# Patient Record
Sex: Female | Born: 1995
Health system: Southern US, Community
[De-identification: ages and names within clinical notes are randomized; demographics above are authoritative.]

## PROBLEM LIST (undated history)

## (undated) DIAGNOSIS — F32A Depression, unspecified: Secondary | ICD-10-CM

## (undated) DIAGNOSIS — F419 Anxiety disorder, unspecified: Secondary | ICD-10-CM

## (undated) HISTORY — DX: Anxiety disorder, unspecified: F41.9

## (undated) HISTORY — DX: Depression, unspecified: F32.A

---
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
## 2015-12-26 ENCOUNTER — Other Ambulatory Visit: Payer: Self-pay | Admitting: Family Medicine

## 2016-05-16 ENCOUNTER — Emergency Department (HOSPITAL_BASED_OUTPATIENT_CLINIC_OR_DEPARTMENT_OTHER): Payer: Self-pay

## 2016-05-16 ENCOUNTER — Emergency Department (HOSPITAL_BASED_OUTPATIENT_CLINIC_OR_DEPARTMENT_OTHER)
Admission: EM | Admit: 2016-05-16 | Discharge: 2016-05-16 | Disposition: A | Discharge status: Home / Self Care | Payer: Self-pay | Attending: Physician Assistant | Admitting: Physician Assistant

## 2016-05-16 ENCOUNTER — Encounter (HOSPITAL_BASED_OUTPATIENT_CLINIC_OR_DEPARTMENT_OTHER): Payer: Self-pay | Admitting: Respiratory Therapy

## 2016-05-16 DIAGNOSIS — J209 Acute bronchitis, unspecified: Secondary | ICD-10-CM | POA: Insufficient documentation

## 2016-05-16 MED ORDER — ALBUTEROL SULFATE HFA 108 (90 BASE) MCG/ACT IN AERS
2.0000 | INHALATION_SPRAY | RESPIRATORY_TRACT | Status: DC | PRN
  Administered 2016-05-16: 2 via RESPIRATORY_TRACT
  Filled 2016-05-16: qty 6.7

## 2016-05-16 MED ORDER — BENZONATATE 100 MG PO CAPS: 100.0000 mg | ORAL_CAPSULE | Freq: Three times a day (TID) | ORAL | 0 refills | Status: DC

## 2016-05-16 NOTE — ED Notes (Signed)
PA at bedside.

## 2016-05-16 NOTE — ED Provider Notes (Signed)
[facility] Provider Note   CSN: [id number] Arrival date & time: 05/16/16  1105  First Provider Contact:  None       History   Chief Complaint Chief Complaint  Patient presents with  . Cough    HPI Joanne Perez is a 20 y.o. female.  Patient presents today with a cough onset 3 days ago.  Cough gradually worsening.  She reports that today she began coughing up mucous, which prompted her to come in for evaluation.  She denies fever or chills.  She states that she had a temp of 99 three days ago.  She reports mild associated SOB.  Denies chest pain.  Denies sinus pain.  Does report associated congestion and right ear pain.  She has taken Tylenol for symptoms without improvement.  She denies any history of Asthma.  She does not smoke and never has.        History reviewed. No pertinent past medical history.  There are no active problems to display for this patient.   History reviewed. No pertinent surgical history.  OB History    No data available       Home Medications    Prior to Admission medications   Not on File    Family History Family History  Problem Relation Age of Onset  . Diabetes Father   . Diabetes Maternal Grandmother   . Diabetes Maternal Grandfather   . Diabetes Paternal Grandmother     Social History Social History  Substance Use Topics  . Smoking status: Never Smoker  . Smokeless tobacco: Never Used  . Alcohol use No     Allergies   Review of patient's allergies indicates no known allergies.   Review of Systems Review of Systems  All other systems reviewed and are negative.    Physical Exam Updated Vital Signs BP 120/70 (BP Location: Right Arm)   Pulse 68   Temp 98.6 F (37 C) (Oral)   Resp 20   Ht  (1.6 m)   Wt 68 kg   LMP 05/06/2016   SpO2 98%   BMI 26.57 kg/m   Physical Exam  Constitutional: She appears well-developed and well-nourished.  HENT:  Head: Normocephalic and atraumatic.  Right Ear:  Tympanic membrane and ear canal normal.  Left Ear: Tympanic membrane and ear canal normal.  Mouth/Throat: Oropharynx is clear and moist.  Neck: Normal range of motion. Neck supple.  Cardiovascular: Normal rate and regular rhythm.   Pulmonary/Chest: Effort normal and breath sounds normal. No respiratory distress. She has no wheezes. She has no rales. She exhibits no tenderness.  Neurological: She is alert.  Skin: Skin is warm and dry. No rash noted.  Psychiatric: She has a normal mood and affect.  Nursing note and vitals reviewed.    ED Treatments / Results  Labs (all labs ordered are listed, but only abnormal results are displayed) Labs Reviewed - No data to display  EKG  EKG Interpretation None       Radiology Dg Chest 2 View  Result Date: 05/16/2016 CLINICAL DATA:  Cough, chest pain and congestion, 3 days duration. Fever. EXAM: CHEST  2 VIEW COMPARISON:  01/11/2006 FINDINGS: Heart size is normal. Mediastinal shadows are normal. There is central bronchial thickening consistent with bronchitis. No infiltrate, collapse or effusion. Bony structures are normal except for mild spinal curvature. IMPRESSION: Bronchitis.  No consolidation or collapse. Mild spinal curvature. Electronically Signed   By: Paulina Fusi M.D.   On: 05/16/2016 11:30  Procedures Procedures (including critical care time)  Medications Ordered in ED Medications - No data to display   Initial Impression / Assessment and Plan / ED Course  I have reviewed the triage vital signs and the nursing notes.  Pertinent labs & imaging results that were available during my care of the patient were reviewed by me and considered in my medical decision making (see chart for details).  Clinical Course    Patient presents today with a cough x 3 days.  She states that this morning she began coughing up mucous.  She is afebrile.  No hypoxia or signs of respiratory distress.  CXR showing signs of Bronchitis, but no  consolidation.  Patient stable for discharge.  Return precautions given.    Final Clinical Impressions(s) / ED Diagnoses   Final diagnoses:  None    New Prescriptions New Prescriptions   No medications on file     Santiago Glad, PA-C 05/16/16 1207    Courteney Lyn Mackuen, MD 05/16/16 1519

## 2016-05-16 NOTE — ED Triage Notes (Signed)
C/o prod cough x 1 week-NAD-steady gait 

## 2016-08-02 ENCOUNTER — Ambulatory Visit (INDEPENDENT_AMBULATORY_CARE_PROVIDER_SITE_OTHER): Payer: Self-pay | Admitting: Physician Assistant

## 2016-08-02 VITALS — BP 118/70 | HR 87 | Temp 97.5°F | Resp 18 | Ht 63.0 in | Wt 150.8 lb

## 2016-08-02 DIAGNOSIS — R8290 Unspecified abnormal findings in urine: Secondary | ICD-10-CM

## 2016-08-02 DIAGNOSIS — N898 Other specified noninflammatory disorders of vagina: Secondary | ICD-10-CM

## 2016-08-02 LAB — POCT WET + KOH PREP
TRICH BY WET PREP: ABSENT
YEAST BY KOH: ABSENT
YEAST BY WET PREP: ABSENT

## 2016-08-02 LAB — POCT URINALYSIS DIP (MANUAL ENTRY)
Bilirubin, UA: NEGATIVE
GLUCOSE UA: NEGATIVE
Ketones, POC UA: NEGATIVE
LEUKOCYTES UA: NEGATIVE
NITRITE UA: NEGATIVE
PROTEIN UA: NEGATIVE
UROBILINOGEN UA: 0.2
pH, UA: 5.5

## 2016-08-02 LAB — POC MICROSCOPIC URINALYSIS (UMFC)

## 2016-08-02 MED ORDER — METRONIDAZOLE 500 MG PO TABS: 500.0000 mg | ORAL_TABLET | Freq: Two times a day (BID) | ORAL | 0 refills | Status: DC

## 2016-08-02 NOTE — Patient Instructions (Addendum)
Do not drink alcohol while on this medication. Please follow up in symptoms do not resolve.   Bacterial Vaginosis Bacterial vaginosis is an infection of the vagina. It happens when too many germs (bacteria) grow in the vagina. Having this infection puts you at risk for getting other infections from sex. Treating this infection can help lower your risk for other infections, such as:   Chlamydia.  Gonorrhea.  HIV.  Herpes. HOME CARE  Take your medicine as told by your doctor.  Finish your medicine even if you start to feel better.  Tell your sex partner that you have an infection. They should see their doctor for treatment.  During treatment:  Avoid sex or use condoms correctly.  Do not douche.  Do not drink alcohol unless your doctor tells you it is ok.  Do not breastfeed unless your doctor tells you it is ok. GET HELP IF:  You are not getting better after 3 days of treatment.  You have more grey fluid (discharge) coming from your vagina than before.  You have more pain than before.  You have a fever. MAKE SURE YOU:   Understand these instructions.  Will watch your condition.  Will get help right away if you are not doing well or get worse.   This information is not intended to replace advice given to you by your health care provider. Make sure you discuss any questions you have with your health care provider.   Document Released: 07/09/2008 Document Revised: 10/21/2014 Document Reviewed: 05/12/2013 Elsevier Interactive Patient Education 2016 [employer]    IF you received an x-ray today, you will receive an invoice from [facility]. Please contact [facility] at [phone] with questions or concerns regarding your invoice.   IF you received labwork today, you will receive an invoice from [facility]. Please contact [facility] at [phone] with questions or concerns regarding your invoice.   Our billing staff  will not be able to assist you with questions regarding bills from these companies.  You will be contacted with the lab results as soon as they are available. The fastest way to get your results is to activate your My Chart account. Instructions are located on the last page of this paperwork. If you have not heard from us regarding the results in 2 weeks, please contact this office.

## 2016-08-02 NOTE — Progress Notes (Signed)
08/02/2016 at 9:25 AM  Joanne Perez / DOB: Aug 18, 1996 / MRN: [id number]  The patient  does not have a problem list on file.  SUBJECTIVE  Joanne Perez is a 20 y.o. female who complains of dysuria and vaginal itching x 2 weeks ago. Notes that she noticed these symptoms after using a bath bomb for the first time. She then took azo and those symptoms resolved but then she developed a thick white discharge that has an odor and malodorous urine.  She denies dysuria, hematuria, urinary frequency, flank pain, abdominal pain, pelvic pain and genital rash. Pt is sexually active with monogamous partner for 2 years. LMP was 07/23/16. Pt drinks about 2 bottles of water a day.   She  has no past medical history on file.    Medications reviewed and updated by myself where necessary, and exist elsewhere in the encounter.   Joanne Perez has No Known Allergies. She  reports that she has never smoked. She has never used smokeless tobacco. She reports that she does not drink alcohol or use drugs. She  has no sexual activity history on file. The patient  has no past surgical history on file.  Her family history includes Diabetes in her father, maternal grandfather, maternal grandmother, and paternal grandmother.  Review of Systems  Constitutional: Negative for chills, diaphoresis and fever.   OBJECTIVE  Her  height is 5\' 3"  (1.6 m) and weight is 150 lb 12.8 oz (68.4 kg). Her oral temperature is 97.5 F (36.4 C). Her blood pressure is 118/70 and her pulse is 87. Her respiration is 18 and oxygen saturation is 99%.  The patient's body mass index is 26.71 kg/m.  Physical Exam  Constitutional: She appears well-developed.  HENT:  Head: Normocephalic and atraumatic.  Eyes: Conjunctivae are normal.  Neck: Normal range of motion.  GI: Soft. There is no tenderness.  Genitourinary: Uterus normal. There is no rash or tenderness on the right labia. There is no rash or tenderness on the left labia. Cervix exhibits  no motion tenderness. Right adnexum displays no mass, no tenderness and no fullness. Left adnexum displays no mass, no tenderness and no fullness. No tenderness in the vagina. Vaginal discharge (creamy white discharge lining vaginal canal) found.    Results for orders placed or performed in visit on 08/02/16 (from the past 24 hour(s))  POCT urinalysis dipstick     Status: Abnormal   Collection Time: 08/02/16  8:21 AM  Result Value Ref Range   Color, UA yellow yellow   Clarity, UA clear clear   Glucose, UA negative negative   Bilirubin, UA negative negative   Ketones, POC UA negative negative   Spec Grav, UA >=1.030    Blood, UA moderate (A) negative   pH, UA 5.5    Protein Ur, POC negative negative   Urobilinogen, UA 0.2    Nitrite, UA Negative Negative   Leukocytes, UA Negative Negative  POCT Microscopic Urinalysis ([facility])     Status: Abnormal   Collection Time: 08/02/16  8:26 AM  Result Value Ref Range   WBC,UR,HPF,POC None None WBC/hpf   RBC,UR,HPF,POC Few (A) None RBC/hpf   Bacteria Moderate (A) None, Too numerous to count   Mucus Present (A) Absent   Epithelial Cells, UR Per Microscopy Few (A) None, Too numerous to count cells/hpf  POCT Wet + KOH Prep     Status: Abnormal   Collection Time: 08/02/16  8:34 AM  Result Value Ref Range   Yeast by KOH  Absent Present, Absent   Yeast by wet prep Absent Present, Absent   WBC by wet prep Few None, Few, Too numerous to count   Clue Cells Wet Prep HPF POC Moderate (A) None, Too numerous to count   Trich by wet prep Absent Present, Absent   Bacteria Wet Prep HPF POC Moderate (A) None, Few, Too numerous to count   Epithelial Cells By [employer]et Pref ([facility]) None None, Few, Too numerous to count   RBC,UR,HPF,POC None None RBC/hpf   ASSESSMENT & PLAN  Joanne Perez was seen today for vaginitis.  Diagnoses and all orders for this visit:  Malodorous urine -     POCT Microscopic Urinalysis ([facility]) -     POCT urinalysis dipstick  Vaginal  discharge -     POCT Wet + KOH Prep -     GC/Chlamydia Probe Amp  Other orders -     metroNIDAZOLE (FLAGYL) 500 MG tablet; Take 1 tablet (500 mg total) by mouth 2 (two) times daily.   -Due to patient's history and symptoms, will treat for BV. .  The patient was advised to call or come back to clinic if she does not see an improvement in symptoms with treatment, or worsens with the above plan.   Joanne CoreBrittany Wesly Whisenant, PA-C [facility] 08/02/2016 9:25 AM

## 2016-08-05 LAB — GC/CHLAMYDIA PROBE AMP
CT Probe RNA: NOT DETECTED
GC PROBE AMP APTIMA: NOT DETECTED

## 2016-08-07 ENCOUNTER — Telehealth: Payer: Self-pay | Admitting: Emergency Medicine

## 2016-08-07 ENCOUNTER — Telehealth: Payer: Self-pay

## 2016-08-07 NOTE — Telephone Encounter (Signed)
Pt given normal test results dated from 08/02/16 Please post results

## 2016-08-07 NOTE — Telephone Encounter (Signed)
Pt is looking for lab results  Best number [phone]

## 2016-08-08 ENCOUNTER — Encounter: Payer: Self-pay | Admitting: Physician Assistant

## 2016-08-08 NOTE — Progress Notes (Signed)
Please send lab letter to patient

## 2017-06-17 ENCOUNTER — Telehealth (HOSPITAL_COMMUNITY): Payer: Self-pay

## 2017-06-18 ENCOUNTER — Telehealth (HOSPITAL_COMMUNITY): Payer: Self-pay

## 2017-07-15 ENCOUNTER — Ambulatory Visit (HOSPITAL_COMMUNITY): Payer: Self-pay | Admitting: Psychiatry

## 2017-08-11 ENCOUNTER — Ambulatory Visit (HOSPITAL_COMMUNITY): Payer: Self-pay | Admitting: Psychiatry

## 2017-09-06 IMAGING — DX DG CHEST 2V
2 series · 2 of 2 positions shown · non-contrast
Comparison: 01/11/2006

CLINICAL DATA: Cough, chest pain and congestion, 3 days duration.
Fever.

EXAM:
CHEST  2 VIEW

[chest pa]
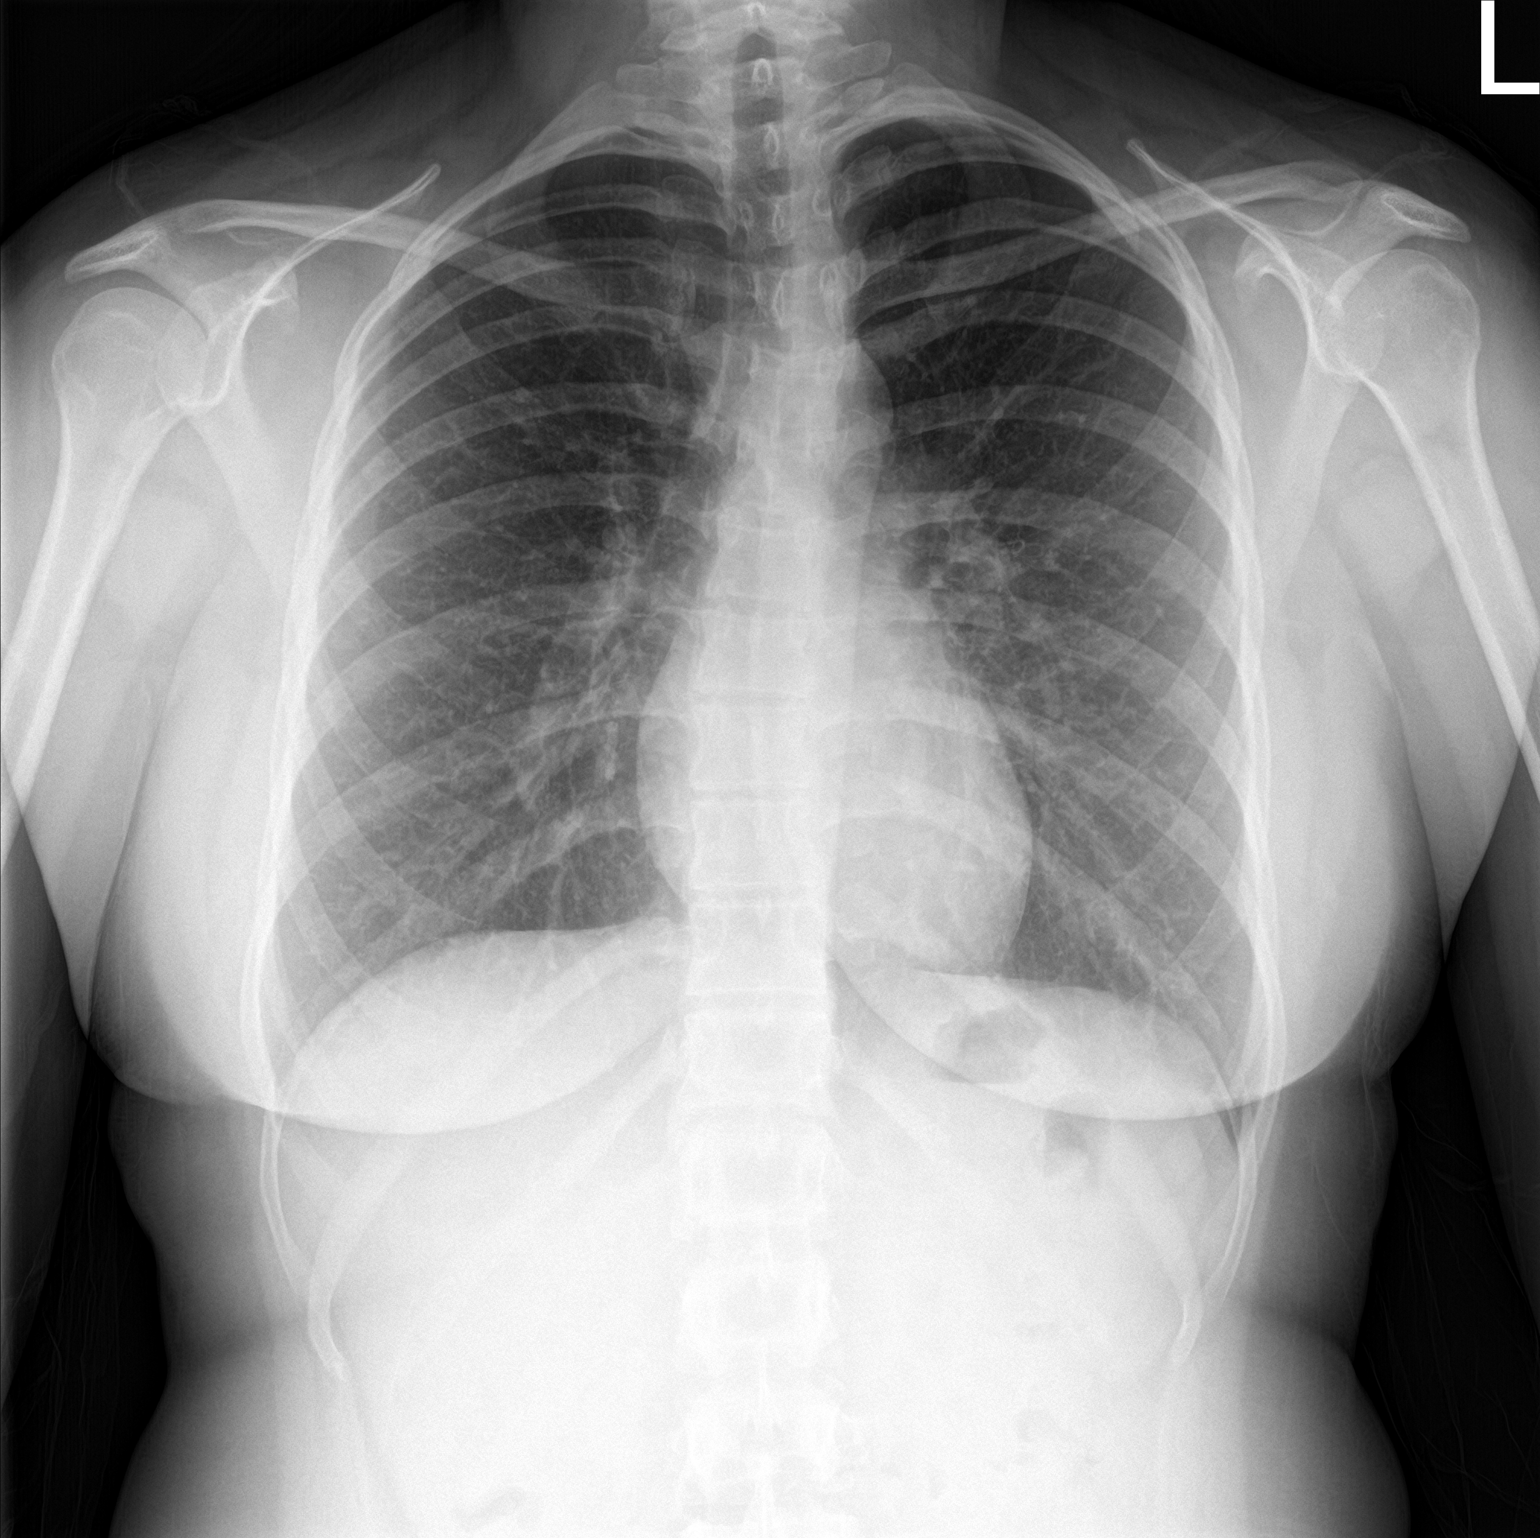

[chest lat]
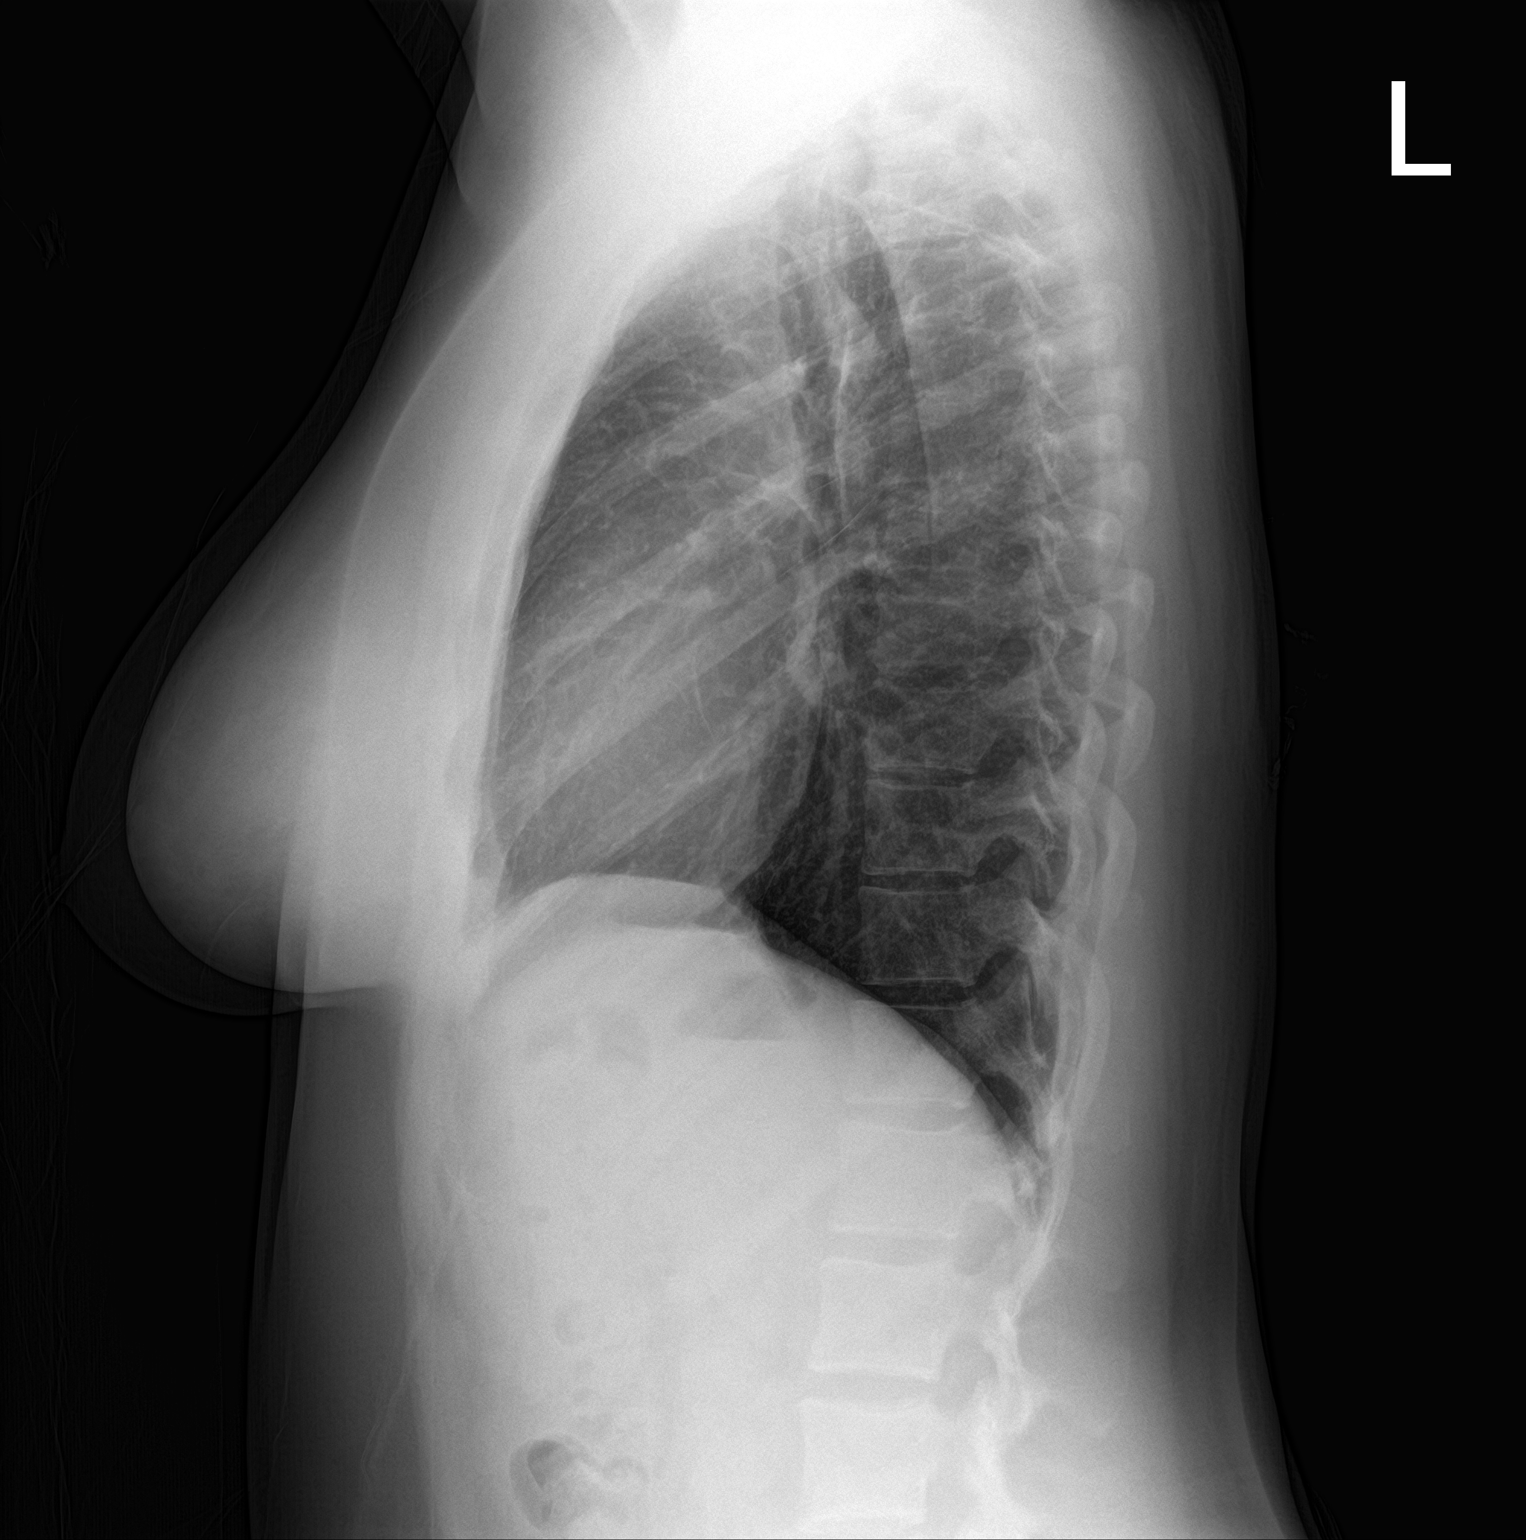

[2 of 2 positions shown; findings below may reference images not displayed]

FINDINGS: Heart size is normal. Mediastinal shadows are normal. There is
central bronchial thickening consistent with bronchitis. No
infiltrate, collapse or effusion. Bony structures are normal except
for mild spinal curvature.
IMPRESSION: Bronchitis.  No consolidation or collapse.

Mild spinal curvature.

## 2018-11-20 ENCOUNTER — Ambulatory Visit (INDEPENDENT_AMBULATORY_CARE_PROVIDER_SITE_OTHER): Payer: BLUE CROSS/BLUE SHIELD | Admitting: Family Medicine

## 2018-11-20 ENCOUNTER — Encounter: Payer: Self-pay | Admitting: Family Medicine

## 2018-11-20 ENCOUNTER — Other Ambulatory Visit: Payer: Self-pay

## 2018-11-20 VITALS — BP 119/70 | HR 75 | Temp 99.4°F | Ht 62.0 in | Wt 157.8 lb

## 2018-11-20 DIAGNOSIS — Z3041 Encounter for surveillance of contraceptive pills: Secondary | ICD-10-CM

## 2018-11-20 DIAGNOSIS — R5383 Other fatigue: Secondary | ICD-10-CM | POA: Diagnosis not present

## 2018-11-20 DIAGNOSIS — Z01411 Encounter for gynecological examination (general) (routine) with abnormal findings: Secondary | ICD-10-CM

## 2018-11-20 DIAGNOSIS — Z113 Encounter for screening for infections with a predominantly sexual mode of transmission: Secondary | ICD-10-CM

## 2018-11-20 DIAGNOSIS — Z23 Encounter for immunization: Secondary | ICD-10-CM | POA: Diagnosis not present

## 2018-11-20 DIAGNOSIS — Z01419 Encounter for gynecological examination (general) (routine) without abnormal findings: Secondary | ICD-10-CM

## 2018-11-20 DIAGNOSIS — F418 Other specified anxiety disorders: Secondary | ICD-10-CM

## 2018-11-20 LAB — POCT URINE PREGNANCY: Preg Test, Ur: NEGATIVE

## 2018-11-20 MED ORDER — TRI-SPRINTEC 0.18/0.215/0.25 MG-35 MCG PO TABS: 1.0000 | ORAL_TABLET | Freq: Every day | ORAL | 3 refills | Status: DC

## 2018-11-20 MED ORDER — ESCITALOPRAM OXALATE 10 MG PO TABS: 10.0000 mg | ORAL_TABLET | Freq: Every day | ORAL | 2 refills | Status: DC

## 2018-11-20 NOTE — Progress Notes (Signed)
2/7/20201:52 PM  Joanne Perez 1996/09/13, 23 y.o. female [id number]  Chief Complaint  Patient presents with  . Annual Exam    CPE w/ PAP  . Fatigue  . Insomnia    HPI:   Patient is a 23 y.o. 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  Fall Risk  11/20/2018 08/02/2016  Falls in the past year? 0 No  Number falls in past yr: 0 -  Injury with Fall? 1 -  Follow up Falls evaluation completed -    GAD 7 : Generalized Anxiety Score 11/20/2018  Nervous, Anxious, on Edge 2  Control/stop worrying 2  Worry too much - different things 2  Trouble relaxing 2  Restless 0  Easily annoyed or irritable 3  Afraid - awful might happen 2  Total GAD 7 Score 13  Anxiety Difficulty Somewhat difficult    Depression screen [facility] 2/[facility] 11/20/2018 08/02/2016  Decreased Interest 2 0  Down, Depressed, Hopeless 1 0  PHQ - 2 Score 3 0  Altered sleeping 3 -  Tired, decreased energy 3 -  Change in appetite 2 -  Feeling bad or failure about yourself  1 -  Trouble concentrating 2 -  Moving slowly or fidgety/restless 0 -  Suicidal thoughts 0 -  PHQ-9 Score 14 -  Difficult doing work/chores Somewhat difficult -    No Known Allergies  Prior to Admission medications   Medication Sig Start Date End Date Taking? Authorizing Provider  traZODone (DESYREL) 100 MG tablet Take 100 mg by mouth at bedtime.   Yes [provider]  TRI-SPRINTEC 0.18/0.215/0.25 MG-35 MCG tablet TAKE 1  TABLET AS DIRECTED 11/06/18  Yes [provider]    No past medical history on file.  No past surgical history on file.  Social History   Tobacco Use  . Smoking status: Never Smoker  . Smokeless tobacco: Never Used  Substance Use Topics  . Alcohol use: No    Alcohol/week: 0.0 standard drinks    Family History  Problem Relation Age of Onset  . Diabetes Father   . Diabetes Maternal Grandmother   . Diabetes Maternal Grandfather   . Diabetes Paternal Grandmother     Review of Systems  Constitutional: Positive for malaise/fatigue. Negative for chills and fever.  Respiratory: Negative for cough and shortness of breath.   Cardiovascular: Negative for chest pain, palpitations and leg swelling.  Gastrointestinal: Negative for abdominal pain, nausea and vomiting.  Psychiatric/Behavioral: Positive for depression. Negative for suicidal ideas.  All other systems reviewed and are negative.    OBJECTIVE:  Blood pressure 119/70, pulse 75, temperature 99.4 F (37.4 C), temperature source Oral, height 5\' 2"  (1.575 m), weight 157 lb 12.8 oz (71.6 kg), last menstrual period 11/05/2018, SpO2 96 %. Body mass index is 28.86 kg/m.   Physical Exam Vitals signs and nursing note reviewed. Exam conducted with a chaperone present.  Constitutional:      Appearance: She is well-developed.  HENT:  Head: Normocephalic and atraumatic.     Right Ear: Hearing, tympanic membrane, ear canal and external ear normal.     Left Ear: Hearing, tympanic membrane, ear canal and external ear normal.  Eyes:     Conjunctiva/sclera: Conjunctivae normal.     Pupils: Pupils are equal, round, and reactive to light.  Neck:     Musculoskeletal: Neck supple.     Thyroid: No thyromegaly.  Cardiovascular:     Rate and Rhythm: Normal rate and regular rhythm.     Heart sounds: Normal heart sounds. No murmur. No friction rub. No gallop.   Pulmonary:     Effort: Pulmonary effort is normal.     Breath  sounds: Normal breath sounds. No wheezing or rales.  Abdominal:     General: Bowel sounds are normal. There is no distension.     Palpations: Abdomen is soft. There is no mass.     Tenderness: There is no abdominal tenderness.  Genitourinary:    General: Normal vulva.     Vagina: Normal.     Cervix: No cervical motion tenderness or friability.     Uterus: Normal. Not enlarged, not fixed and not tender.      Adnexa: Right adnexa normal and left adnexa normal.       Right: No mass or tenderness.         Left: No mass or tenderness.    Musculoskeletal: Normal range of motion.  Lymphadenopathy:     Cervical: No cervical adenopathy.  Skin:    General: Skin is warm and dry.  Neurological:     Mental Status: She is alert and oriented to person, place, and time.     Cranial Nerves: No cranial nerve deficit.     Gait: Gait normal.     Deep Tendon Reflexes: Reflexes are normal and symmetric.     Results for orders placed or performed in visit on 11/20/18 (from the past 24 hour(s))  POCT urine pregnancy     Status: None   Collection Time: 11/20/18  2:32 PM  Result Value Ref Range   Preg Test, Ur Negative Negative    ASSESSMENT and PLAN  1. Women's annual routine gynecological examination Routine HCM labs ordered. HCM reviewed/discussed. Anticipatory guidance regarding healthy weight, lifestyle and choices given.  - Pap IG w/ reflex to HPV when ASC-U  2. Encounter for surveillance of contraceptive pills Happy with method. Refilled for 1 year - POCT urine pregnancy  3. Depression with anxiety Uncontrolled. Discussed treatment options. start lexapro, reviewed r/se/b  4. Fatigue, unspecified type - TSH - CBC - Comprehensive metabolic panel  5. Screen for STD (sexually transmitted disease) - GC/Chlamydia Probe Amp([facility]) - HIV antibody (with reflex)  Other orders - Td vaccine greater than or equal to 7yo preservative free IM - HPV 9-valent vaccine,Recombinat - escitalopram  (LEXAPRO) 10 MG tablet; Take 1 tablet (10 mg total) by mouth daily. - TRI-SPRINTEC 0.18/0.215/0.25 MG-35 MCG tablet; Take 1 tablet by mouth daily.  Return in about 4 weeks (around 12/18/2018) for depression and anxiety.    Myles LippsIrma M Santiago, MD [facility] at [facility] [address] [place], [place] [address] Ph.  [phone] Fax [phone]

## 2018-11-20 NOTE — Patient Instructions (Addendum)
  Vaccine against HPV (human papilloma virus) is a 3-dose series. Your second dose was given today. Your 3rd dose will be due in 12 weeks from now.   If you have lab work done today you will be contacted with your lab results within the next 2 weeks.  If you have not heard from [place] then please contact us. The fastest way to get your results is to register for My Chart.   IF you received an x-ray today, you will receive an invoice from [facility]. Please contact [facility] at [phone] with questions or concerns regarding your invoice.   IF you received labwork today, you will receive an invoice from [facility]. Please contact [facility] at [phone] with questions or concerns regarding your invoice.   Our billing staff will not be able to assist you with questions regarding bills from these companies.  You will be contacted with the lab results as soon as they are available. The fastest way to get your results is to activate your My Chart account. Instructions are located on the last page of this paperwork. If you have not heard from [place] regarding the results in 2 weeks, please contact this office.

## 2018-11-21 LAB — CBC
Hematocrit: 36.2 % (ref 34.0–46.6)
Hemoglobin: 11.9 g/dL (ref 11.1–15.9)
MCH: 25.5 pg — ABNORMAL LOW (ref 26.6–33.0)
MCHC: 32.9 g/dL (ref 31.5–35.7)
MCV: 78 fL — ABNORMAL LOW (ref 79–97)
Platelets: 265 10*3/uL (ref 150–450)
RBC: 4.67 x10E6/uL (ref 3.77–5.28)
RDW: 14.3 % (ref 11.7–15.4)
WBC: 9.1 10*3/uL (ref 3.4–10.8)

## 2018-11-21 LAB — COMPREHENSIVE METABOLIC PANEL
ALT: 15 IU/L (ref 0–32)
AST: 15 IU/L (ref 0–40)
Albumin/Globulin Ratio: 2 (ref 1.2–2.2)
Albumin: 4.7 g/dL (ref 3.9–5.0)
Alkaline Phosphatase: 58 IU/L (ref 39–117)
BUN/Creatinine Ratio: 16 (ref 9–23)
BUN: 12 mg/dL (ref 6–20)
Bilirubin Total: <0.2 mg/dL (ref 0.0–1.2)
CO2: 21 mmol/L (ref 20–29)
Calcium: 8.9 mg/dL (ref 8.7–10.2)
Chloride: 101 mmol/L (ref 96–106)
Creatinine, Ser: 0.73 mg/dL (ref 0.57–1.00)
GFR calc Af Amer: 135 mL/min/{1.73_m2} (ref >59)
GFR calc non Af Amer: 117 mL/min/{1.73_m2} (ref >59)
Globulin, Total: 2.4 g/dL (ref 1.5–4.5)
Glucose: 101 mg/dL — ABNORMAL HIGH (ref 65–99)
Potassium: 3.6 mmol/L (ref 3.5–5.2)
Sodium: 140 mmol/L (ref 134–144)
Total Protein: 7.1 g/dL (ref 6.0–8.5)

## 2018-11-21 LAB — TSH: TSH: 1.47 u[IU]/mL (ref 0.450–4.500)

## 2018-11-21 LAB — HIV ANTIBODY (ROUTINE TESTING W REFLEX): HIV Screen 4th Generation wRfx: NONREACTIVE

## 2018-11-24 LAB — GC/CHLAMYDIA PROBE AMP
Chlamydia trachomatis, NAA: NEGATIVE
Neisseria gonorrhoeae by PCR: NEGATIVE

## 2018-11-25 LAB — PAP IG W/ RFLX HPV ASCU

## 2019-01-14 ENCOUNTER — Encounter: Payer: Self-pay | Admitting: Family Medicine

## 2019-01-14 MED ORDER — SERTRALINE HCL 25 MG PO TABS: 25.0000 mg | ORAL_TABLET | Freq: Every day | ORAL | 2 refills | Status: DC

## 2019-05-27 ENCOUNTER — Other Ambulatory Visit: Payer: Self-pay

## 2019-05-27 DIAGNOSIS — Z20822 Contact with and (suspected) exposure to covid-19: Secondary | ICD-10-CM

## 2019-05-29 LAB — NOVEL CORONAVIRUS, NAA: SARS-CoV-2, NAA: NOT DETECTED

## 2019-05-29 LAB — SPECIMEN STATUS REPORT

## 2019-07-20 ENCOUNTER — Encounter: Payer: Self-pay | Admitting: Family Medicine

## 2019-08-06 ENCOUNTER — Ambulatory Visit: Payer: BLUE CROSS/BLUE SHIELD | Admitting: Family Medicine

## 2019-08-10 ENCOUNTER — Ambulatory Visit (INDEPENDENT_AMBULATORY_CARE_PROVIDER_SITE_OTHER): Payer: BC Managed Care – PPO | Admitting: Family Medicine

## 2019-08-10 ENCOUNTER — Other Ambulatory Visit: Payer: Self-pay

## 2019-08-10 ENCOUNTER — Encounter: Payer: Self-pay | Admitting: Family Medicine

## 2019-08-10 VITALS — BP 112/76 | HR 82 | Temp 98.8°F | Ht 62.0 in | Wt 165.8 lb

## 2019-08-10 DIAGNOSIS — R5383 Other fatigue: Secondary | ICD-10-CM | POA: Diagnosis not present

## 2019-08-10 DIAGNOSIS — G44229 Chronic tension-type headache, not intractable: Secondary | ICD-10-CM | POA: Diagnosis not present

## 2019-08-10 DIAGNOSIS — R635 Abnormal weight gain: Secondary | ICD-10-CM

## 2019-08-10 DIAGNOSIS — F321 Major depressive disorder, single episode, moderate: Secondary | ICD-10-CM

## 2019-08-10 LAB — POCT URINE PREGNANCY: Preg Test, Ur: NEGATIVE

## 2019-08-10 MED ORDER — SERTRALINE HCL 50 MG PO TABS: 50.0000 mg | ORAL_TABLET | Freq: Every day | ORAL | 3 refills | Status: DC

## 2019-08-10 NOTE — Patient Instructions (Signed)
° ° ° °  If you have lab work done today you will be contacted with your lab results within the next 2 weeks.  If you have not heard from us then please contact us. The fastest way to get your results is to register for My Chart. ° ° °IF you received an x-ray today, you will receive an invoice from [facility]. Please contact  Radiology at [phone] with questions or concerns regarding your invoice.  ° °IF you received labwork today, you will receive an invoice from [facility]. Please contact [facility] at [phone] with questions or concerns regarding your invoice.  ° °Our billing staff will not be able to assist you with questions regarding bills from these companies. ° °You will be contacted with the lab results as soon as they are available. The fastest way to get your results is to activate your My Chart account. Instructions are located on the last page of this paperwork. If you have not heard from us regarding the results in 2 weeks, please contact this office. °  ° ° ° °
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
## 2019-08-11 LAB — COMPREHENSIVE METABOLIC PANEL
ALT: 45 IU/L — ABNORMAL HIGH (ref 0–32)
AST: 34 IU/L (ref 0–40)
Albumin/Globulin Ratio: 1.8 (ref 1.2–2.2)
Albumin: 4.4 g/dL (ref 3.9–5.0)
Alkaline Phosphatase: 62 IU/L (ref 39–117)
BUN/Creatinine Ratio: 21 (ref 9–23)
BUN: 13 mg/dL (ref 6–20)
Bilirubin Total: 0.2 mg/dL (ref 0.0–1.2)
CO2: 21 mmol/L (ref 20–29)
Calcium: 8.7 mg/dL (ref 8.7–10.2)
Chloride: 105 mmol/L (ref 96–106)
Creatinine, Ser: 0.61 mg/dL (ref 0.57–1.00)
GFR calc Af Amer: 149 mL/min/{1.73_m2} (ref >59)
GFR calc non Af Amer: 129 mL/min/{1.73_m2} (ref >59)
Globulin, Total: 2.4 g/dL (ref 1.5–4.5)
Glucose: 84 mg/dL (ref 65–99)
Potassium: 3.9 mmol/L (ref 3.5–5.2)
Sodium: 138 mmol/L (ref 134–144)
Total Protein: 6.8 g/dL (ref 6.0–8.5)

## 2019-08-11 LAB — CBC
Hematocrit: 37.6 % (ref 34.0–46.6)
Hemoglobin: 12 g/dL (ref 11.1–15.9)
MCH: 24.4 pg — ABNORMAL LOW (ref 26.6–33.0)
MCHC: 31.9 g/dL (ref 31.5–35.7)
MCV: 77 fL — ABNORMAL LOW (ref 79–97)
Platelets: 255 10*3/uL (ref 150–450)
RBC: 4.91 x10E6/uL (ref 3.77–5.28)
RDW: 14.2 % (ref 11.7–15.4)
WBC: 9.6 10*3/uL (ref 3.4–10.8)

## 2019-08-11 LAB — TSH: TSH: 2.04 u[IU]/mL (ref 0.450–4.500)

## 2019-08-11 LAB — HEMOGLOBIN A1C
Est. average glucose Bld gHb Est-mCnc: 111 mg/dL
Hgb A1c MFr Bld: 5.5 % (ref 4.8–5.6)

## 2019-09-06 ENCOUNTER — Encounter: Payer: Self-pay | Admitting: Family Medicine

## 2019-09-06 ENCOUNTER — Ambulatory Visit (INDEPENDENT_AMBULATORY_CARE_PROVIDER_SITE_OTHER): Payer: Self-pay | Admitting: Family Medicine

## 2019-09-06 ENCOUNTER — Other Ambulatory Visit: Payer: Self-pay

## 2019-09-06 VITALS — BP 113/65 | HR 94 | Temp 98.5°F | Resp 12 | Ht 63.0 in | Wt 163.0 lb

## 2019-09-06 DIAGNOSIS — F321 Major depressive disorder, single episode, moderate: Secondary | ICD-10-CM

## 2019-09-06 DIAGNOSIS — G44229 Chronic tension-type headache, not intractable: Secondary | ICD-10-CM

## 2019-09-06 DIAGNOSIS — R945 Abnormal results of liver function studies: Secondary | ICD-10-CM

## 2019-09-06 NOTE — Patient Instructions (Addendum)
    Trial a sertraline during the day x 1 week, if still having issues will change to prozac (fluoxetine). Let me know thru mychart  If you have lab work done today you will be contacted with your lab results within the next 2 weeks.  If you have not heard from [place] then please contact us. The fastest way to get your results is to register for My Chart.   IF you received an x-ray today, you will receive an invoice from [facility]. Please contact [facility] at [phone] with questions or concerns regarding your invoice.   IF you received labwork today, you will receive an invoice from [facility]. Please contact [facility] at [phone] with questions or concerns regarding your invoice.   Our billing staff will not be able to assist you with questions regarding bills from these companies.  You will be contacted with the lab results as soon as they are available. The fastest way to get your results is to activate your My Chart account. Instructions are located on the last page of this paperwork. If you have not heard from [place] regarding the results in 2 weeks, please contact this office.

## 2019-09-06 NOTE — Progress Notes (Signed)
11/23/20208:35 AM  Joanne Perez [birth year], 23 y.o., female [id number]  Chief Complaint  Patient presents with  . Follow-up    Pt stated after taking the Rx Zoloft after 2 hrs--waking up with the arms/legs having tingling sensation. Depression=#15,  GAD7= #9.    HPI:   Patient is a 23 y.o. female with past medical history significant for headaches and depression who presents today for followup  Last oV a month ago  Started sertraline - not tolerating - a week after increase to 50mg  a day, mood sign improved (waking up feeling well, having energy, wanting to do things, driven)  but she then started to notice paresthesias at night, uncomfortable enough that it would wake her up.  She then started taking med every other night and on nights she did not take med she had no issues. She has stopped taking med at this time  phq9 and gad noted  She also noticed that her headaches were sign improved as her mood improved Now that her mood is worsening again, her headaches are coming back  She was taking APAP for her headaches  Trial of lexapro - did not tolerate - headaches, weight gain, nausea  Depression screen [facility] 2/9 09/06/2019 08/10/2019 11/20/2018  Decreased Interest 1 0 2  Down, Depressed, Hopeless 1 0 1  PHQ - 2 Score 2 0 3  Altered sleeping 3 - 3  Tired, decreased energy 1 - 3  Change in appetite 3 - 2  Feeling bad or failure about yourself  0 - 1  Trouble concentrating 3 - 2  Moving slowly or fidgety/restless 3 - 0  Suicidal thoughts 0 - 0  PHQ-9 Score 15 - 14  Difficult doing work/chores Not difficult at all - Somewhat difficult   GAD 7 : Generalized Anxiety Score 09/06/2019 11/20/2018  Nervous, Anxious, on Edge 0 2  Control/stop worrying 1 2  Worry too much - different things 1 2  Trouble relaxing 3 2  Restless 0 0  Easily annoyed or irritable 3 3  Afraid - awful might happen 1 2  Total GAD 7 Score 9 13  Anxiety Difficulty - Somewhat difficult      Fall Risk  09/06/2019 08/10/2019 11/20/2018 08/02/2016  Falls in the past year? 0 0 0 No  Number falls in past yr: 0 0 0 -  Injury with Fall? 0 0 1 -  Follow up - - Falls evaluation completed -     No Known Allergies  Prior to Admission medications   Medication Sig Start Date End Date Taking? Authorizing Provider  sertraline (ZOLOFT) 50 MG tablet Take 1 tablet (50 mg total) by mouth daily. 08/10/19  Yes Rutherford Guys, MD  TRI-SPRINTEC 0.18/0.215/0.25 MG-35 MCG tablet Take 1 tablet by mouth daily. 11/20/18  Yes Rutherford Guys, MD    History reviewed. No pertinent past medical history.  History reviewed. No pertinent surgical history.  Social History   Tobacco Use  . Smoking status: Never Smoker  . Smokeless tobacco: Never Used  Substance Use Topics  . Alcohol use: No    Alcohol/week: 0.0 standard drinks    Family History  Problem Relation Age of Onset  . Diabetes Father   . Diabetes Maternal Grandmother   . Diabetes Maternal Grandfather   . Diabetes Paternal Grandmother     ROS Per hpi   OBJECTIVE:  Today's Vitals   09/06/19 0822  BP: 113/65  Pulse: 94  Resp: 12  Temp: 98.5 F (36.9  C)  SpO2: 97%  Weight: 163 lb (73.9 kg)  Height: 5\' 3"  (1.6 m)   Body mass index is 28.87 kg/m.   Physical Exam Vitals signs and nursing note reviewed.  Constitutional:      Appearance: She is well-developed.  HENT:     Head: Normocephalic and atraumatic.  Eyes:     General: No scleral icterus.    Conjunctiva/sclera: Conjunctivae normal.     Pupils: Pupils are equal, round, and reactive to light.  Neck:     Musculoskeletal: Neck supple.  Pulmonary:     Effort: Pulmonary effort is normal.  Skin:    General: Skin is warm and dry.  Neurological:     Mental Status: She is alert and oriented to person, place, and time.     No results found for this or any previous visit (from the past 24 hour(s)).  No results found.   ASSESSMENT and PLAN  1. Current  moderate episode of major depressive disorder without prior episode ([facility]) Was responding well to sertraline but started having side effects, trial of taking med in the am, if still having side effects, then trial of prozac, communication via mychart.  2. Chronic tension-type headache, not intractable Stop using APAP. NSAIDs for now. Improved sign with mood. Re-eval at next OV  3. Abnormal liver function 2/2 APAP use? Recheck today - Hepatic Function Panel  Return in about 3 months (around 12/07/2019).    12/09/2019, MD Primary Care at [facility] [address] [place], [place] Ph.  [phone] Fax [phone]

## 2019-09-07 LAB — HEPATIC FUNCTION PANEL
ALT: 82 IU/L — ABNORMAL HIGH (ref 0–32)
AST: 53 IU/L — ABNORMAL HIGH (ref 0–40)
Albumin: 4.3 g/dL (ref 3.9–5.0)
Alkaline Phosphatase: 75 IU/L (ref 39–117)
Bilirubin Total: <0.2 mg/dL (ref 0.0–1.2)
Bilirubin, Direct: 0.06 mg/dL (ref 0.00–0.40)
Total Protein: 7 g/dL (ref 6.0–8.5)

## 2019-09-07 NOTE — Addendum Note (Signed)
Addended by: Rutherford Guys on: 09/07/2019 09:59 AM   Modules accepted: Orders

## 2019-09-10 ENCOUNTER — Ambulatory Visit: Payer: BC Managed Care – PPO | Admitting: Family Medicine

## 2019-09-15 ENCOUNTER — Emergency Department (HOSPITAL_COMMUNITY): Payer: Self-pay

## 2019-09-15 ENCOUNTER — Other Ambulatory Visit: Payer: Self-pay

## 2019-09-15 ENCOUNTER — Emergency Department (HOSPITAL_COMMUNITY)
Admission: EM | Admit: 2019-09-15 | Discharge: 2019-09-15 | Disposition: A | Discharge status: Home / Self Care | Payer: Self-pay | Attending: Emergency Medicine | Admitting: Emergency Medicine

## 2019-09-15 DIAGNOSIS — R1110 Vomiting, unspecified: Secondary | ICD-10-CM | POA: Insufficient documentation

## 2019-09-15 DIAGNOSIS — R531 Weakness: Secondary | ICD-10-CM | POA: Insufficient documentation

## 2019-09-15 DIAGNOSIS — U071 COVID-19: Secondary | ICD-10-CM | POA: Insufficient documentation

## 2019-09-15 DIAGNOSIS — R519 Headache, unspecified: Secondary | ICD-10-CM | POA: Insufficient documentation

## 2019-09-15 DIAGNOSIS — Z79899 Other long term (current) drug therapy: Secondary | ICD-10-CM | POA: Insufficient documentation

## 2019-09-15 LAB — CBC WITH DIFFERENTIAL/PLATELET
Abs Immature Granulocytes: 0.02 10*3/uL (ref 0.00–0.07)
Basophils Absolute: 0 10*3/uL (ref 0.0–0.1)
Basophils Relative: 1 %
Eosinophils Absolute: 0.2 10*3/uL (ref 0.0–0.5)
Eosinophils Relative: 3 %
HCT: 44.1 % (ref 36.0–46.0)
Hemoglobin: 13.6 g/dL (ref 12.0–15.0)
Immature Granulocytes: 0 %
Lymphocytes Relative: 37 %
Lymphs Abs: 2.2 10*3/uL (ref 0.7–4.0)
MCH: 25 pg — ABNORMAL LOW (ref 26.0–34.0)
MCHC: 30.8 g/dL (ref 30.0–36.0)
MCV: 81.2 fL (ref 80.0–100.0)
Monocytes Absolute: 0.5 10*3/uL (ref 0.1–1.0)
Monocytes Relative: 8 %
Neutro Abs: 3.1 10*3/uL (ref 1.7–7.7)
Neutrophils Relative %: 51 %
Platelets: 257 10*3/uL (ref 150–400)
RBC: 5.43 MIL/uL — ABNORMAL HIGH (ref 3.87–5.11)
RDW: 13.9 % (ref 11.5–15.5)
WBC: 6.1 10*3/uL (ref 4.0–10.5)
nRBC: 0 % (ref 0.0–0.2)

## 2019-09-15 LAB — COMPREHENSIVE METABOLIC PANEL
ALT: 111 U/L — ABNORMAL HIGH (ref 0–44)
AST: 84 U/L — ABNORMAL HIGH (ref 15–41)
Albumin: 4.6 g/dL (ref 3.5–5.0)
Alkaline Phosphatase: 63 U/L (ref 38–126)
Anion gap: 10 (ref 5–15)
BUN: 9 mg/dL (ref 6–20)
CO2: 23 mmol/L (ref 22–32)
Calcium: 8.9 mg/dL (ref 8.9–10.3)
Chloride: 106 mmol/L (ref 98–111)
Creatinine, Ser: 0.59 mg/dL (ref 0.44–1.00)
GFR calc Af Amer: >60 mL/min (ref >60)
GFR calc non Af Amer: >60 mL/min (ref >60)
Glucose, Bld: 88 mg/dL (ref 70–99)
Potassium: 3.9 mmol/L (ref 3.5–5.1)
Sodium: 139 mmol/L (ref 135–145)
Total Bilirubin: 0.6 mg/dL (ref 0.3–1.2)
Total Protein: 7.9 g/dL (ref 6.5–8.1)

## 2019-09-15 MED ORDER — DOXYCYCLINE HYCLATE 100 MG PO CAPS: 100.0000 mg | ORAL_CAPSULE | Freq: Two times a day (BID) | ORAL | 0 refills | Status: DC

## 2019-09-15 MED ORDER — ALBUTEROL SULFATE HFA 108 (90 BASE) MCG/ACT IN AERS: 2.0000 | INHALATION_SPRAY | RESPIRATORY_TRACT | 0 refills | Status: DC | PRN

## 2019-09-15 MED ORDER — KETOROLAC TROMETHAMINE 30 MG/ML IJ SOLN
30.0000 mg | Freq: Once | INTRAMUSCULAR | Status: AC
  Administered 2019-09-15: 30 mg via INTRAVENOUS
  Filled 2019-09-15: qty 1

## 2019-09-15 MED ORDER — IBUPROFEN 800 MG PO TABS: 800.0000 mg | ORAL_TABLET | Freq: Three times a day (TID) | ORAL | 0 refills | Status: DC | PRN

## 2019-09-15 MED ORDER — SODIUM CHLORIDE 0.9 % IV BOLUS
1000.0000 mL | Freq: Once | INTRAVENOUS | Status: AC
Start: 2019-09-15 — End: 2019-09-15
  Administered 2019-09-15: 18:00:00 1000 mL via INTRAVENOUS

## 2019-09-15 NOTE — ED Provider Notes (Signed)
[facility] DEPT Provider Note   CSN: [id number] Arrival date & time: 09/15/19  1625     History   Chief Complaint Chief Complaint  Patient presents with  . Headache  . Emesis  . Diarrhea  . Shortness of Breath  . Weakness    HPI Joanne Perez is a 23 y.o. female.     Patient complains of diarrhea.  She has Covid infection.  The history is provided by the patient. No language interpreter was used.  Diarrhea Quality:  Semi-solid Severity:  Mild Onset quality:  Gradual Timing:  Constant Progression:  Unchanged Relieved by:  Nothing Worsened by:  Nothing Ineffective treatments:  None tried Associated symptoms: no abdominal pain and no headaches     No past medical history on file.  There are no active problems to display for this patient.   No past surgical history on file.   OB History   No obstetric history on file.      Home Medications    Prior to Admission medications   Medication Sig Start Date End Date Taking? Authorizing Provider  albuterol (VENTOLIN HFA) 108 (90 Base) MCG/ACT inhaler Inhale 2 puffs into the lungs every 4 (four) hours as needed for wheezing or shortness of breath. 09/15/19   Milton Ferguson, MD  doxycycline (VIBRAMYCIN) 100 MG capsule Take 1 capsule (100 mg total) by mouth 2 (two) times daily. One po bid x 7 days 09/15/19   Milton Ferguson, MD  ibuprofen (ADVIL) 800 MG tablet Take 1 tablet (800 mg total) by mouth every 8 (eight) hours as needed. 09/15/19   Milton Ferguson, MD  sertraline (ZOLOFT) 50 MG tablet Take 1 tablet (50 mg total) by mouth daily. 08/10/19   Rutherford Guys, MD  TRI-SPRINTEC 0.18/0.215/0.25 MG-35 MCG tablet Take 1 tablet by mouth daily. 11/20/18   Rutherford Guys, MD    Family History Family History  Problem Relation Age of Onset  . Diabetes Father   . Diabetes Maternal Grandmother   . Diabetes Maternal Grandfather   . Diabetes Paternal Grandmother     Social History  Social History   Tobacco Use  . Smoking status: Never Smoker  . Smokeless tobacco: Never Used  Substance Use Topics  . Alcohol use: No    Alcohol/week: 0.0 standard drinks  . Drug use: No     Allergies   Patient has no known allergies.   Review of Systems Review of Systems  Constitutional: Negative for appetite change and fatigue.       Myalgia  HENT: Negative for congestion, ear discharge and sinus pressure.   Eyes: Negative for discharge.  Respiratory: Negative for cough.   Cardiovascular: Negative for chest pain.  Gastrointestinal: Positive for diarrhea. Negative for abdominal pain.  Genitourinary: Negative for frequency and hematuria.  Musculoskeletal: Negative for back pain.  Skin: Negative for rash.  Neurological: Negative for seizures and headaches.  Psychiatric/Behavioral: Negative for hallucinations.     Physical Exam Updated Vital Signs BP 115/69   Pulse 71   Temp 98.8 F (37.1 C) (Oral)   Resp (!) 22   Ht 5\' 3"  (1.6 m)   Wt 73.9 kg   SpO2 100%   BMI 28.87 kg/m   Physical Exam Vitals signs and nursing note reviewed.  Constitutional:      Appearance: She is well-developed.  HENT:     Head: Normocephalic.     Nose: Nose normal.  Eyes:     General: No scleral icterus.  Conjunctiva/sclera: Conjunctivae normal.  Neck:     Musculoskeletal: Neck supple.     Thyroid: No thyromegaly.  Cardiovascular:     Rate and Rhythm: Normal rate and regular rhythm.     Heart sounds: No murmur. No friction rub. No gallop.   Pulmonary:     Breath sounds: No stridor. No wheezing or rales.  Chest:     Chest wall: No tenderness.  Abdominal:     General: There is no distension.     Tenderness: There is no abdominal tenderness. There is no rebound.  Musculoskeletal: Normal range of motion.  Lymphadenopathy:     Cervical: No cervical adenopathy.  Skin:    Findings: No erythema or rash.  Neurological:     Mental Status: She is oriented to person, place, and  time.     Motor: No abnormal muscle tone.     Coordination: Coordination normal.  Psychiatric:        Behavior: Behavior normal.      ED Treatments / Results  Labs (all labs ordered are listed, but only abnormal results are displayed) Labs Reviewed  CBC WITH DIFFERENTIAL/PLATELET - Abnormal; Notable for the following components:      Result Value   RBC 5.43 (*)    MCH 25.0 (*)    All other components within normal limits  COMPREHENSIVE METABOLIC PANEL - Abnormal; Notable for the following components:   AST 84 (*)    ALT 111 (*)    All other components within normal limits    EKG None  Radiology Dg Chest Port 1 View  Result Date: 09/15/2019 CLINICAL DATA:  Chest pain COVID positive EXAM: PORTABLE CHEST 1 VIEW COMPARISON:  05/16/2016 FINDINGS: Hazy and patchy airspace disease in the left mid to lower lung. Normal heart size. No pneumothorax IMPRESSION: Hazy ill-defined opacity left mid to lower lung, may reflect pneumonia Electronically Signed   By: Jasmine Pang M.D.   On: 09/15/2019 18:16    Procedures Procedures (including critical care time)  Medications Ordered in ED Medications  sodium chloride 0.9 % bolus 1,000 mL (0 mLs Intravenous Stopped 09/15/19 1911)  ketorolac (TORADOL) 30 MG/ML injection 30 mg (30 mg Intravenous Given 09/15/19 1744)     Initial Impression / Assessment and Plan / ED Course  I have reviewed the triage vital signs and the nursing notes.  Pertinent labs & imaging results that were available during my care of the patient were reviewed by me and considered in my medical decision making (see chart for details).        Joanne Perez was evaluated in Emergency Department on 09/15/2019 for the symptoms described in the history of present illness. She was evaluated in the context of the global COVID-19 pandemic, which necessitated consideration that the patient might be at risk for infection with the SARS-CoV-2 virus that causes COVID-19.  Institutional protocols and algorithms that pertain to the evaluation of patients at risk for COVID-19 are in a state of rapid change based on information released by regulatory bodies including the CDC and federal and state organizations. These policies and algorithms were followed during the patient's care in the ED. Labs unremarkable.  Patient is nontoxic.  Chest x-ray suggests pneumonia.  Patient will be covered with doxycycline along with given albuterol inhaler and Motrin for pain.  She will drink plenty of fluids and follow-up with Dr.  Final Clinical Impressions(s) / ED Diagnoses   Final diagnoses:  COVID-19    ED Discharge Orders  Ordered    doxycycline (VIBRAMYCIN) 100 MG capsule  2 times daily     09/15/19 1938    albuterol (VENTOLIN HFA) 108 (90 Base) MCG/ACT inhaler  Every 4 hours PRN     09/15/19 1938    ibuprofen (ADVIL) 800 MG tablet  Every 8 hours PRN     09/15/19 1938           Bethann BerkshireZammit, Uday Jantz, MD 09/15/19 1941

## 2019-09-15 NOTE — ED Triage Notes (Signed)
Pt with PMH anxiety and depression came into ED with mother. Pt and mother both recently tested positive for COVID on 09/07/19 at [facility]. Pt has loss of taste and smell, diarrhea, vomiting, no fevers or tremors, no cough, mild sneezes.

## 2019-09-15 NOTE — Discharge Instructions (Addendum)
Follow-up with your doctor if needed drink plenty of fluids

## 2019-09-20 ENCOUNTER — Encounter: Payer: Self-pay | Admitting: Family Medicine

## 2019-09-20 ENCOUNTER — Other Ambulatory Visit: Payer: Self-pay

## 2019-09-20 ENCOUNTER — Telehealth (INDEPENDENT_AMBULATORY_CARE_PROVIDER_SITE_OTHER): Payer: HRSA Program | Admitting: Family Medicine

## 2019-09-20 DIAGNOSIS — U071 COVID-19: Secondary | ICD-10-CM

## 2019-09-20 MED ORDER — BENZONATATE 100 MG PO CAPS: 100.0000 mg | ORAL_CAPSULE | Freq: Three times a day (TID) | ORAL | 0 refills | Status: DC | PRN

## 2019-09-20 NOTE — Progress Notes (Signed)
Pt went to hospital 12/3 for sob and cough due to covid. She is using her inhaler and doxycycline for the sx. She is still having the cough and headache with some lightheadedness. Employer is asking for letter for her to remain out of work and a letter stating when she is to return.

## 2019-09-20 NOTE — Progress Notes (Signed)
Virtual Visit Note  I connected with patient on 09/20/19 at 549pm by phone and verified that I am speaking with the correct person using two identifiers. Joanne Perez is currently located at home and patient is currently with them during visit. The provider, Rutherford Guys, MD is located in their office at time of visit.  I discussed the limitations, risks, security and privacy concerns of performing an evaluation and management service by telephone and the availability of in person appointments. I also discussed with the patient that there may be a patient responsible charge related to this service. The patient expressed understanding and agreed to proceed.   CC: covid positive  HPI ? Seen in ER on dec 2nd - tested positive for covid cxr with haziness left lobe - possible PNA tx doxy/albuterol/ibuprofen Feeling much better Still has cough, headaches, fatigue, muscle aches Had a fever last night but none today Has SOB with simple activities such showering albuterol helps Able to monitor her oxygen 92% or higher Has been in quarantine since diagnosis    No Known Allergies  Prior to Admission medications   Medication Sig Start Date End Date Taking? Authorizing Provider  albuterol (VENTOLIN HFA) 108 (90 Base) MCG/ACT inhaler Inhale 2 puffs into the lungs every 4 (four) hours as needed for wheezing or shortness of breath. 09/15/19  Yes Milton Ferguson, MD  doxycycline (VIBRAMYCIN) 100 MG capsule Take 1 capsule (100 mg total) by mouth 2 (two) times daily. One po bid x 7 days 09/15/19  Yes Milton Ferguson, MD  ibuprofen (ADVIL) 800 MG tablet Take 1 tablet (800 mg total) by mouth every 8 (eight) hours as needed. 09/15/19  Yes Milton Ferguson, MD  sertraline (ZOLOFT) 50 MG tablet Take 1 tablet (50 mg total) by mouth daily. 08/10/19  Yes Rutherford Guys, MD  TRI-SPRINTEC 0.18/0.215/0.25 MG-35 MCG tablet Take 1 tablet by mouth daily. 11/20/18  Yes Rutherford Guys, MD  azithromycin  (ZITHROMAX) 250 MG tablet TAKE 2 TABLETS BY MOUTH ON DAY 1 AND THEN TAKE 1 TABLET BY MOUTH ONCE A DAY ON DAY 2 THROUGH DAY 5 09/08/19   [provider]    No past medical history on file.  No past surgical history on file.  Social History   Tobacco Use  . Smoking status: Never Smoker  . Smokeless tobacco: Never Used  Substance Use Topics  . Alcohol use: No    Alcohol/week: 0.0 standard drinks    Family History  Problem Relation Age of Onset  . Diabetes Father   . Diabetes Maternal Grandmother   . Diabetes Maternal Grandfather   . Diabetes Paternal Grandmother     ROS Per hpi  Objective  Vitals as reported by the patient: per above  Gen: AAOx3, NAD Resp: speaking in full sentences, breathing comfortably   ASSESSMENT and PLAN  1. COVID-19 virus infection Patient feeling better. Discussed supportive measures, quarantine guidelines and ER precautions  FOLLOW-UP: prn.   The above assessment and management plan was discussed with the patient. The patient verbalized understanding of and has agreed to the management plan. Patient is aware to call the clinic if symptoms persist or worsen. Patient is aware when to return to the clinic for a follow-up visit. Patient educated on when it is appropriate to go to the emergency department.    I provided 7 minutes of non-face-to-face time during this encounter.  Rutherford Guys, MD Primary Care at [place], [place] [address] Ph.  [phone] Fax [phone]

## 2019-09-21 ENCOUNTER — Telehealth: Payer: Self-pay

## 2019-09-21 NOTE — Telephone Encounter (Signed)
Sent pt wrong letter, pt is pos for  covid. Letter states that she was not. I corrected the letter, sent bk via mychart.

## 2019-11-26 ENCOUNTER — Ambulatory Visit: Payer: Self-pay | Admitting: Family Medicine

## 2019-12-03 ENCOUNTER — Ambulatory Visit: Payer: Self-pay | Admitting: Family Medicine

## 2019-12-06 ENCOUNTER — Ambulatory Visit: Payer: Self-pay | Admitting: Family Medicine

## 2020-01-11 ENCOUNTER — Encounter: Payer: Self-pay | Admitting: Family Medicine

## 2020-03-23 ENCOUNTER — Other Ambulatory Visit: Payer: Self-pay

## 2020-03-23 ENCOUNTER — Telehealth: Payer: Self-pay | Admitting: Family Medicine

## 2020-03-24 ENCOUNTER — Encounter: Payer: Self-pay | Admitting: Family Medicine

## 2020-03-24 ENCOUNTER — Ambulatory Visit (INDEPENDENT_AMBULATORY_CARE_PROVIDER_SITE_OTHER): Payer: No Typology Code available for payment source | Admitting: Family Medicine

## 2020-03-24 ENCOUNTER — Other Ambulatory Visit: Payer: Self-pay

## 2020-03-24 ENCOUNTER — Ambulatory Visit: Payer: Self-pay | Admitting: Family Medicine

## 2020-03-24 VITALS — BP 119/77 | HR 73 | Temp 98.2°F | Ht 63.0 in | Wt 176.0 lb

## 2020-03-24 DIAGNOSIS — Z789 Other specified health status: Secondary | ICD-10-CM | POA: Diagnosis not present

## 2020-03-24 DIAGNOSIS — S46812A Strain of other muscles, fascia and tendons at shoulder and upper arm level, left arm, initial encounter: Secondary | ICD-10-CM | POA: Diagnosis not present

## 2020-03-24 DIAGNOSIS — F418 Other specified anxiety disorders: Secondary | ICD-10-CM

## 2020-03-24 MED ORDER — SERTRALINE HCL 50 MG PO TABS: 50.0000 mg | ORAL_TABLET | Freq: Every day | ORAL | 3 refills | Status: DC

## 2020-03-24 MED ORDER — TRI-SPRINTEC 0.18/0.215/0.25 MG-35 MCG PO TABS: 1.0000 | ORAL_TABLET | Freq: Every day | ORAL | 1 refills | Status: DC

## 2020-03-24 MED ORDER — DICLOFENAC SODIUM 75 MG PO TBEC: 75.0000 mg | DELAYED_RELEASE_TABLET | Freq: Two times a day (BID) | ORAL | 0 refills | Status: DC

## 2020-03-24 MED ORDER — METHOCARBAMOL 500 MG PO TABS: ORAL_TABLET | ORAL | 0 refills | Status: DC

## 2020-03-24 NOTE — Progress Notes (Signed)
Joanne Reason, MD 6/11/2021Patient ID: Joanne Perez, female    DOB: Sep 22, 1996  Age: 24 y.o. MRN: [id number]  Chief Complaint  Patient presents with   Shoulder Pain    pt reports pain in her L shoulder blade. pt reports no know trigger for this pain. started 1.5 months ago. pain is of and on until this week the pain has become constant. pain is sharp, dull, and constant ache. 7/10 pain scale.    Subjective:  For the past month or so patient has been having some pain in her left scapula area.  It hurts when she moves her arm around and when she holds the steering wheel with left hand.  She knows of no specific injury.  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  She knows that she has been doing things a little differently because of that.  She is taken some Tylenol for it.    She did request refills of her antidepressant and birth control pills.  Current allergies, medications, problem list, past/family and social histories reviewed.  Objective:  BP 119/77    Pulse 73    Temp 98.2 F (36.8 C) (Temporal)    Ht 5\' 3"  (1.6 m)    Wt 176 lb (79.8 kg)    SpO2 97%    BMI 31.18 kg/m  No major acute distress.  Neck has full range of motion.  No real point tenderness.  She has a little bit of high dowagers hump which her mother apparently commented on.  Left shoulder has good range of motion.  Arm strength is good.  Assessment & Plan:   Assessment: 1. Strain of left subscapularis muscle, initial encounter   2. Depression with anxiety   3. Uses birth control       Plan: See instructions.  No orders of the defined types were placed in this encounter.   Meds ordered this encounter  Medications   sertraline (ZOLOFT) 50 MG tablet    Sig: Take 1 tablet (50 mg total) by mouth daily.    Dispense:  30 tablet    Refill:  3   TRI-SPRINTEC 0.18/0.215/0.25 MG-35 MCG tablet    Sig: Take 1 tablet by mouth daily.    Dispense:  84 tablet    Refill:  1    3  packs (computer only asks for # of pills   diclofenac (VOLTAREN) 75 MG EC tablet    Sig: Take 1 tablet (75 mg total) by mouth 2 (two) times daily.    Dispense:  30 tablet    Refill:  0   methocarbamol (ROBAXIN) 500 MG tablet    Sig: Take 1 3 times daily as needed for muscle relaxant    Dispense:  30 tablet    Refill:  0         Patient Instructions    Take methocarbamol (Robaxin) 500 mg 1 pill 3 times daily as needed for muscle relaxant.  When you go to work on Monday you probably want to skip the daytime doses.  May make you little drowsy.  Take diclofenac 75 mg 1 twice daily for pain and inflammation.  This is a anti-inflammatory medicine in the NSAID class like ibuprofen and Aleve.  Therefore do not take over-the-counter anti-inflammatories with this.  Take with food to clear his it may irritate an empty stomach.  I refilled your birth control pills and your antidepressant/antianxiety medication.  Follow-up with Dr. Pamella Pert sometime this fall.  Return if the scapular area is not doing better  Do the liberating shoulder exercises that we talked about several times a day.  Use heat/ice on the shoulder and upper back as needed.     If you have lab work done today you will be contacted with your lab results within the next 2 weeks.  If you have not heard from [place] then please contact us. The fastest way to get your results is to register for My Chart.   IF you received an x-ray today, you will receive an invoice from [facility]. Please contact [facility] at [phone] with questions or concerns regarding your invoice.   IF you received labwork today, you will receive an invoice from [place]. Please contact [facility] at [phone] with questions or concerns regarding your invoice.   Our billing staff will not be able to assist you with questions regarding bills from these companies.  You will be contacted with the lab results as soon as they are  available. The fastest way to get your results is to activate your My Chart account. Instructions are located on the last page of this paperwork. If you have not heard from [place] regarding the results in 2 weeks, please contact this office.        Return if symptoms worsen or fail to improve.   Janace Hoard, MD 03/24/2020

## 2020-03-24 NOTE — Patient Instructions (Addendum)
  Take methocarbamol (Robaxin) 500 mg 1 pill 3 times daily as needed for muscle relaxant.  When you go to work on Monday you probably want to skip the daytime doses.  May make you little drowsy.  Take diclofenac 75 mg 1 twice daily for pain and inflammation.  This is a anti-inflammatory medicine in the NSAID class like ibuprofen and Aleve.  Therefore do not take over-the-counter anti-inflammatories with this.  Take with food to clear his it may irritate an empty stomach.  I refilled your birth control pills and your antidepressant/antianxiety medication.  Follow-up with Dr. Leretha Pol sometime this fall.  Return if the scapular area is not doing better  Do the liberating shoulder exercises that we talked about several times a day.  Use heat/ice on the shoulder and upper back as needed.     If you have lab work done today you will be contacted with your lab results within the next 2 weeks.  If you have not heard from [place] then please contact us. The fastest way to get your results is to register for My Chart.   IF you received an x-ray today, you will receive an invoice from [facility]. Please contact [facility] at [phone] with questions or concerns regarding your invoice.   IF you received labwork today, you will receive an invoice from [place]. Please contact [facility] at [phone] with questions or concerns regarding your invoice.   Our billing staff will not be able to assist you with questions regarding bills from these companies.  You will be contacted with the lab results as soon as they are available. The fastest way to get your results is to activate your My Chart account. Instructions are located on the last page of this paperwork. If you have not heard from [place] regarding the results in 2 weeks, please contact this office.

## 2020-04-09 ENCOUNTER — Encounter (HOSPITAL_COMMUNITY): Payer: Self-pay

## 2020-04-09 ENCOUNTER — Ambulatory Visit (HOSPITAL_COMMUNITY)
Admission: EM | Admit: 2020-04-09 | Discharge: 2020-04-09 | Disposition: A | Discharge status: Home / Self Care | Payer: No Typology Code available for payment source | Attending: Urgent Care | Admitting: Urgent Care

## 2020-04-09 DIAGNOSIS — R070 Pain in throat: Secondary | ICD-10-CM

## 2020-04-09 DIAGNOSIS — J0101 Acute recurrent maxillary sinusitis: Secondary | ICD-10-CM | POA: Diagnosis not present

## 2020-04-09 DIAGNOSIS — R0981 Nasal congestion: Secondary | ICD-10-CM

## 2020-04-09 MED ORDER — AMOXICILLIN 875 MG PO TABS: 875.0000 mg | ORAL_TABLET | Freq: Two times a day (BID) | ORAL | 0 refills | Status: DC

## 2020-04-09 MED ORDER — CETIRIZINE HCL 10 MG PO TABS: 10.0000 mg | ORAL_TABLET | Freq: Every day | ORAL | 0 refills | Status: DC

## 2020-04-09 MED ORDER — PSEUDOEPHEDRINE HCL 60 MG PO TABS: 60.0000 mg | ORAL_TABLET | Freq: Three times a day (TID) | ORAL | 0 refills | Status: DC | PRN

## 2020-04-09 NOTE — ED Triage Notes (Signed)
Pt presents with sinus congestion x3 days. Pt is complaining of pressure behind, under eyes, in ears. Pt also complaining of runny nose. Pt denies fever, chills, body aches, nausea, vomiting, sore throat, loss of taste or smell. Pt has been treating with tylenol, and Nyquil with out relief. Pt not requesting covid testing at this time.

## 2020-04-09 NOTE — ED Provider Notes (Signed)
[facility]   MRN: [id number] DOB: 03-21-1996  Subjective:   Joanne Perez is a 24 y.o. female presenting for 3-day history of recurrent sinus pressure, congestion facial fullness over the left side of her face.  Has also had left-sided ear popping.  Reports history of persistent sinus trouble but has never seen an ENT for this.  Had COVID-19 last year.  Does not want to be Covid tested again.  No current facility-administered medications for this encounter.  Current Outpatient Medications:  .  diclofenac (VOLTAREN) 75 MG EC tablet, Take 1 tablet (75 mg total) by mouth 2 (two) times daily., Disp: 30 tablet, Rfl: 0 .  ibuprofen (ADVIL) 800 MG tablet, Take 1 tablet (800 mg total) by mouth every 8 (eight) hours as needed., Disp: 12 tablet, Rfl: 0 .  methocarbamol (ROBAXIN) 500 MG tablet, Take 1 3 times daily as needed for muscle relaxant, Disp: 30 tablet, Rfl: 0 .  sertraline (ZOLOFT) 50 MG tablet, Take 1 tablet (50 mg total) by mouth daily., Disp: 30 tablet, Rfl: 3 .  TRI-SPRINTEC 0.18/0.215/0.25 MG-35 MCG tablet, Take 1 tablet by mouth daily., Disp: 84 tablet, Rfl: 1 .  albuterol (VENTOLIN HFA) 108 (90 Base) MCG/ACT inhaler, Inhale 2 puffs into the lungs every 4 (four) hours as needed for wheezing or shortness of breath., Disp: 8 g, Rfl: 0 .  azithromycin (ZITHROMAX) 250 MG tablet, TAKE 2 TABLETS BY MOUTH ON DAY 1 AND THEN TAKE 1 TABLET BY MOUTH ONCE A DAY ON DAY 2 THROUGH DAY 5, Disp: , Rfl:  .  benzonatate (TESSALON) 100 MG capsule, Take 1-2 capsules (100-200 mg total) by mouth 3 (three) times daily as needed for cough., Disp: 40 capsule, Rfl: 0 .  doxycycline (VIBRAMYCIN) 100 MG capsule, Take 1 capsule (100 mg total) by mouth 2 (two) times daily. One po bid x 7 days, Disp: 14 capsule, Rfl: 0   No Known Allergies  Past Medical History:  Diagnosis Date  . Anxiety    [employer]rees[employer] 03/21/2020  . Depression    [employer] 03/21/2020     History reviewed. No pertinent surgical  history.  Family History  Problem Relation Age of Onset  . Diabetes Father   . Diabetes Maternal Grandmother   . Diabetes Maternal Grandfather   . Diabetes Paternal Grandmother     Social History   Tobacco Use  . Smoking status: Never Smoker  . Smokeless tobacco: Never Used  Substance Use Topics  . Alcohol use: No    Alcohol/week: 0.0 standard drinks  . Drug use: No    ROS   Objective:   Vitals: BP 129/83 (BP Location: Right Arm)   Pulse 88   Temp 98.4 F (36.9 C)   Resp 16   LMP 03/19/2020 (Approximate)   SpO2 100%   Physical Exam Constitutional:      General: She is not in acute distress.    Appearance: She is well-developed. She is not ill-appearing.  HENT:     Head: Normocephalic and atraumatic.     Comments: Trace swelling of left side of face.     Right Ear: Tympanic membrane and ear canal normal. No drainage or tenderness. No middle ear effusion. Tympanic membrane is not erythematous.     Left Ear: Tympanic membrane and ear canal normal. No drainage or tenderness.  No middle ear effusion. Tympanic membrane is not erythematous.     Nose: Congestion (Left-sided, nasal passage minimally patent) and rhinorrhea present.     Mouth/Throat:  Mouth: Mucous membranes are moist. No oral lesions.     Pharynx: No pharyngeal swelling, oropharyngeal exudate, posterior oropharyngeal erythema or uvula swelling.     Tonsils: No tonsillar exudate or tonsillar abscesses.  Eyes:     General: No scleral icterus.       Right eye: No discharge.        Left eye: No discharge.     Extraocular Movements:     Right eye: Normal extraocular motion.     Left eye: Normal extraocular motion.     Conjunctiva/sclera: Conjunctivae normal.     Pupils: Pupils are equal, round, and reactive to light.  Cardiovascular:     Rate and Rhythm: Normal rate.  Pulmonary:     Effort: Pulmonary effort is normal.  Musculoskeletal:     Cervical back: Normal range of motion and neck supple.    Lymphadenopathy:     Cervical: No cervical adenopathy.  Skin:    General: Skin is warm and dry.  Neurological:     General: No focal deficit present.     Mental Status: She is alert and oriented to person, place, and time.  Psychiatric:        Mood and Affect: Mood normal.        Behavior: Behavior normal.      Assessment and Plan :   PDMP not reviewed this encounter.  1. Acute recurrent maxillary sinusitis   2. Nasal congestion   3. Throat pain     Start amoxicillin to address recurrent sinusitis.  Emphasized need to take Zyrtec daily, pseudoephedrine as needed.  Follow-up with ENT for persistent sinus infections. Counseled patient on potential for adverse effects with medications prescribed/recommended today, ER and return-to-clinic precautions discussed, patient verbalized understanding.    Jaynee Eagles, PA-C 04/09/20 1346

## 2020-04-09 NOTE — Discharge Instructions (Signed)
Please make sure you follow-up with [facility] ENT for your persistent sinus trouble.  In the meantime take Zyrtec every day and use pseudoephedrine as needed.  Use amoxicillin for this next week to cover for sinus infection.

## 2020-04-24 ENCOUNTER — Ambulatory Visit: Payer: Self-pay | Admitting: Emergency Medicine

## 2020-06-16 ENCOUNTER — Encounter: Payer: Self-pay | Admitting: Family Medicine

## 2020-06-16 ENCOUNTER — Other Ambulatory Visit: Payer: Self-pay

## 2020-06-16 ENCOUNTER — Ambulatory Visit (INDEPENDENT_AMBULATORY_CARE_PROVIDER_SITE_OTHER): Payer: No Typology Code available for payment source | Admitting: Family Medicine

## 2020-06-16 VITALS — BP 120/85 | HR 69 | Temp 98.7°F | Ht 63.0 in | Wt 173.0 lb

## 2020-06-16 DIAGNOSIS — Z0001 Encounter for general adult medical examination with abnormal findings: Secondary | ICD-10-CM | POA: Diagnosis not present

## 2020-06-16 DIAGNOSIS — F418 Other specified anxiety disorders: Secondary | ICD-10-CM

## 2020-06-16 DIAGNOSIS — Z1329 Encounter for screening for other suspected endocrine disorder: Secondary | ICD-10-CM | POA: Diagnosis not present

## 2020-06-16 DIAGNOSIS — Z3041 Encounter for surveillance of contraceptive pills: Secondary | ICD-10-CM | POA: Diagnosis not present

## 2020-06-16 DIAGNOSIS — Z13228 Encounter for screening for other metabolic disorders: Secondary | ICD-10-CM

## 2020-06-16 DIAGNOSIS — Z131 Encounter for screening for diabetes mellitus: Secondary | ICD-10-CM

## 2020-06-16 DIAGNOSIS — Z0000 Encounter for general adult medical examination without abnormal findings: Secondary | ICD-10-CM

## 2020-06-16 LAB — POCT URINE PREGNANCY: Preg Test, Ur: NEGATIVE

## 2020-06-16 MED ORDER — IBUPROFEN 800 MG PO TABS: 800.0000 mg | ORAL_TABLET | Freq: Three times a day (TID) | ORAL | 0 refills | Status: DC | PRN

## 2020-06-16 MED ORDER — LEVONORGEST-ETH ESTRAD 91-DAY 0.15-0.03 &0.01 MG PO TABS: 1.0000 | ORAL_TABLET | Freq: Every day | ORAL | 4 refills | Status: DC

## 2020-06-16 MED ORDER — SERTRALINE HCL 50 MG PO TABS: 50.0000 mg | ORAL_TABLET | Freq: Every day | ORAL | 1 refills | Status: DC

## 2020-06-16 NOTE — Patient Instructions (Addendum)
   If you have lab work done today you will be contacted with your lab results within the next 2 weeks.  If you have not heard from us then please contact us. The fastest way to get your results is to register for My Chart.   IF you received an x-ray today, you will receive an invoice from [facility]. Please contact [facility] at [phone] with questions or concerns regarding your invoice.   IF you received labwork today, you will receive an invoice from [facility]. Please contact [facility] at [phone] with questions or concerns regarding your invoice.   Our billing staff will not be able to assist you with questions regarding bills from these companies.  You will be contacted with the lab results as soon as they are available. The fastest way to get your results is to activate your My Chart account. Instructions are located on the last page of this paperwork. If you have not heard from us regarding the results in 2 weeks, please contact this office.     Preventive Care 21-39 Years Old, Female Preventive care refers to visits with your health care provider and lifestyle choices that can promote health and wellness. This includes:  A yearly physical exam. This may also be called an annual well check.  Regular dental visits and eye exams.  Immunizations.  Screening for certain conditions.  Healthy lifestyle choices, such as eating a healthy diet, getting regular exercise, not using drugs or products that contain nicotine and tobacco, and limiting alcohol use. What can I expect for my preventive care visit? Physical exam Your health care provider will check your:  Height and weight. This may be used to calculate body mass index (BMI), which tells if you are at a healthy weight.  Heart rate and blood pressure.  Skin for abnormal spots. Counseling Your health care provider may ask you questions about your:  Alcohol, tobacco, and drug use.  Emotional  well-being.  Home and relationship well-being.  Sexual activity.  Eating habits.  Work and work environment.  Method of birth control.  Menstrual cycle.  Pregnancy history. What immunizations do I need?  Influenza (flu) vaccine  This is recommended every year. Tetanus, diphtheria, and pertussis (Tdap) vaccine  You may need a Td booster every 10 years. Varicella (chickenpox) vaccine  You may need this if you have not been vaccinated. Human papillomavirus (HPV) vaccine  If recommended by your health care provider, you may need three doses over 6 months. Measles, mumps, and rubella (MMR) vaccine  You may need at least one dose of MMR. You may also need a second dose. Meningococcal conjugate (MenACWY) vaccine  One dose is recommended if you are age 19-21 years and a first-year college student living in a residence hall, or if you have one of several medical conditions. You may also need additional booster doses. Pneumococcal conjugate (PCV13) vaccine  You may need this if you have certain conditions and were not previously vaccinated. Pneumococcal polysaccharide (PPSV23) vaccine  You may need one or two doses if you smoke cigarettes or if you have certain conditions. Hepatitis A vaccine  You may need this if you have certain conditions or if you travel or work in places where you may be exposed to hepatitis A. Hepatitis B vaccine  You may need this if you have certain conditions or if you travel or work in places where you may be exposed to hepatitis B. Haemophilus influenzae type b (Hib) vaccine  You   may need this if you have certain conditions. You may receive vaccines as individual doses or as more than one vaccine together in one shot (combination vaccines). Talk with your health care provider about the risks and benefits of combination vaccines. What tests do I need?  Blood tests  Lipid and cholesterol levels. These may be checked every 5 years starting at age  20.  Hepatitis C test.  Hepatitis B test. Screening  Diabetes screening. This is done by checking your blood sugar (glucose) after you have not eaten for a while (fasting).  Sexually transmitted disease (STD) testing.  BRCA-related cancer screening. This may be done if you have a family history of breast, ovarian, tubal, or peritoneal cancers.  Pelvic exam and Pap test. This may be done every 3 years starting at age 21. Starting at age 30, this may be done every 5 years if you have a Pap test in combination with an HPV test. Talk with your health care provider about your test results, treatment options, and if necessary, the need for more tests. Follow these instructions at home: Eating and drinking   Eat a diet that includes fresh fruits and vegetables, whole grains, lean protein, and low-fat dairy.  Take vitamin and mineral supplements as recommended by your health care provider.  Do not drink alcohol if: ? Your health care provider tells you not to drink. ? You are pregnant, may be pregnant, or are planning to become pregnant.  If you drink alcohol: ? Limit how much you have to 0-1 drink a day. ? Be aware of how much alcohol is in your drink. In the [place] one drink equals one 12 oz bottle of beer (355 mL), one 5 oz glass of wine (148 mL), or one 1 oz glass of hard liquor (44 mL). Lifestyle  Take daily care of your teeth and gums.  Stay active. Exercise for at least 30 minutes on 5 or more days each week.  Do not use any products that contain nicotine or tobacco, such as cigarettes, e-cigarettes, and chewing tobacco. If you need help quitting, ask your health care provider.  If you are sexually active, practice safe sex. Use a condom or other form of birth control (contraception) in order to prevent pregnancy and STIs (sexually transmitted infections). If you plan to become pregnant, see your health care provider for a preconception visit. What's next?  Visit your health  care provider once a year for a well check visit.  Ask your health care provider how often you should have your eyes and teeth checked.  Stay up to date on all vaccines. This information is not intended to replace advice given to you by your health care provider. Make sure you discuss any questions you have with your health care provider. Document Revised: 06/11/2018 Document Reviewed: 06/11/2018 Elsevier Patient Education  2020 [employer]  

## 2020-06-16 NOTE — Progress Notes (Signed)
9/3/202111:21 AM  Joanne Perez 04/16/1996, 24 y.o., female [id number]  Chief Complaint  Patient presents with  . Annual Exam    Pt reports her general health is fine with no complaints.    HPI:   Patient is a 24 y.o. female with past medical history significant for depression and anxiety who presents today for CPE   Pap: 2020, normal STD: 2020, monogamous relationship, declines today BC : OCP Menses: LMP today, monthly, having increased menstrual cramping, becoming more painful, takes ibu, increased flow and duration, was well controlled on current OCP but not so much for past 6 months  Mammogram: n/a FHX breast/ovarian cancer: none FHx colon cancer: none Exercise/diet: starting to exercise more, avg diet Has completed covid vaccines Will get flu vaccine thru work Most Recent Immunizations  Administered Date(s) Administered  . HPV 9-valent 11/20/2018  . Td 11/20/2018  eye: yearly, appt in oct Dentist: q 6 months  Depression screen [facility] 2/9 06/16/2020 03/24/2020 09/20/2019  Decreased Interest 1 0 0  Down, Depressed, Hopeless 1 1 0  PHQ - 2 Score 2 1 0  Altered sleeping 2 - -  Tired, decreased energy 2 - -  Change in appetite 1 - -  Feeling bad or failure about yourself  0 - -  Trouble concentrating 1 - -  Moving slowly or fidgety/restless 0 - -  Suicidal thoughts 0 - -  PHQ-9 Score 8 - -  Difficult doing work/chores - - -    Fall Risk  06/16/2020 03/24/2020 09/20/2019 09/06/2019 08/10/2019  Falls in the past year? 0 0 0 0 0  Number falls in past yr: - - 0 0 0  Injury with Fall? - - 0 0 0  Follow up Falls evaluation completed Falls evaluation completed - - -     No Known Allergies  Prior to Admission medications   Medication Sig Start Date End Date Taking? Authorizing Provider  cetirizine (ZYRTEC ALLERGY) 10 MG tablet Take 1 tablet (10 mg total) by mouth daily. 04/09/20  Yes Wallis Bamberg, PA-C  ibuprofen (ADVIL) 800 MG tablet Take 1 tablet (800 mg total)  by mouth every 8 (eight) hours as needed. 09/15/19  Yes Bethann Berkshire, MD  methocarbamol (ROBAXIN) 500 MG tablet Take 1 3 times daily as needed for muscle relaxant 03/24/20  Yes Peyton Najjar, MD  pseudoephedrine (SUDAFED) 60 MG tablet Take 1 tablet (60 mg total) by mouth every 8 (eight) hours as needed for congestion. 04/09/20  Yes Wallis Bamberg, PA-C  sertraline (ZOLOFT) 50 MG tablet Take 1 tablet (50 mg total) by mouth daily. 03/24/20  Yes Peyton Najjar, MD  TRI-SPRINTEC 0.18/0.215/0.25 MG-35 MCG tablet Take 1 tablet by mouth daily. 03/24/20  Yes Peyton Najjar, MD    Past Medical History:  Diagnosis Date  . Anxiety    Phreesia 03/21/2020  . Depression    Phreesia 03/21/2020    History reviewed. No pertinent surgical history.  Social History   Tobacco Use  . Smoking status: Never Smoker  . Smokeless tobacco: Never Used  Substance Use Topics  . Alcohol use: Yes    Alcohol/week: 1.0 standard drink    Types: 1 Cans of beer per week    Family History  Problem Relation Age of Onset  . Diabetes Father   . Diabetes Maternal Grandmother   . Diabetes Maternal Grandfather   . Diabetes Paternal Grandmother     Review of Systems  Constitutional: Negative for chills and fever.  Respiratory:  Negative for cough and shortness of breath.   Cardiovascular: Negative for chest pain, palpitations and leg swelling.  Gastrointestinal: Negative for abdominal pain, blood in stool, constipation, diarrhea, melena, nausea and vomiting.  Genitourinary: Negative for dysuria and hematuria.  Skin: Positive for rash (bumpy rash on arms, chronic).  Neurological: Negative for dizziness and headaches.  Endo/Heme/Allergies: Negative for polydipsia.  Psychiatric/Behavioral: Positive for depression (doing well, on sertraline, phq9 noted). Negative for suicidal ideas.  All other systems reviewed and are negative. per hpi   OBJECTIVE:  Today's Vitals   06/16/20 1040  BP: 120/85  Pulse: 69  Temp:  98.7 F (37.1 C)  TempSrc: Temporal  SpO2: 98%  Weight: 173 lb (78.5 kg)  Height: 5\' 3"  (1.6 m)   Body mass index is 30.65 kg/m.   Physical Exam Vitals and nursing note reviewed.  Constitutional:      Appearance: She is well-developed.  HENT:     Head: Normocephalic and atraumatic.     Right Ear: Hearing, tympanic membrane, ear canal and external ear normal.     Left Ear: Hearing, tympanic membrane, ear canal and external ear normal.     Mouth/Throat:     Mouth: Mucous membranes are moist.     Pharynx: No oropharyngeal exudate or posterior oropharyngeal erythema.  Eyes:     Extraocular Movements: Extraocular movements intact.     Conjunctiva/sclera: Conjunctivae normal.     Pupils: Pupils are equal, round, and reactive to light.  Neck:     Thyroid: No thyromegaly.  Cardiovascular:     Rate and Rhythm: Normal rate and regular rhythm.     Heart sounds: Normal heart sounds. No murmur heard.  No friction rub. No gallop.   Pulmonary:     Effort: Pulmonary effort is normal.     Breath sounds: Normal breath sounds. No wheezing, rhonchi or rales.  Abdominal:     General: Bowel sounds are normal. There is no distension.     Palpations: Abdomen is soft. There is no hepatomegaly, splenomegaly or mass.     Tenderness: There is no abdominal tenderness.  Musculoskeletal:        General: Normal range of motion.     Cervical back: Neck supple.     Right lower leg: No edema.     Left lower leg: No edema.  Lymphadenopathy:     Cervical: No cervical adenopathy.  Skin:    General: Skin is warm and dry.     Findings: Rash (KP) present.  Neurological:     Mental Status: She is alert and oriented to person, place, and time.     Cranial Nerves: No cranial nerve deficit.     Gait: Gait normal.     Deep Tendon Reflexes: Reflexes are normal and symmetric.  Psychiatric:        Mood and Affect: Mood normal.        Behavior: Behavior normal.     Results for orders placed or performed in  visit on 06/16/20 (from the past 24 hour(s))  POCT urine pregnancy     Status: None   Collection Time: 06/16/20 11:34 AM  Result Value Ref Range   Preg Test, Ur Negative Negative    No results found.   ASSESSMENT and PLAN  1. Annual physical exam No concerns per history or exam. Routine HCM labs ordered. HCM reviewed/discussed. Anticipatory guidance regarding healthy weight, lifestyle and choices given.   2. Screening for diabetes mellitus (DM) - Hemoglobin A1c  3. Screening  for hypothyroidism - TSH  4. Encounter for screening for metabolic disorder - Comprehensive metabolic panel  5. Encounter for surveillance of contraceptive pills Having increased painful menses, changing to seasonique, reviewed r/se/b and RTC precautions. discussed LARC, consider pelvic US to r/o polyps, fibroids, etc. TSH pending - POCT urine pregnancy - negative  6. Depression with anxiety Stable. Cont sertraline.  - sertraline (ZOLOFT) 50 MG tablet; Take 1 tablet (50 mg total) by mouth daily.  Other orders - ibuprofen (ADVIL) 800 MG tablet; Take 1 tablet (800 mg total) by mouth every 8 (eight) hours as needed for cramping. - Levonorgestrel-Ethinyl Estradiol (AMETHIA) 0.15-0.03 &0.01 MG tablet; Take 1 tablet by mouth daily.  Return in about 1 year (around 06/16/2021).    Myles Lipps, MD Primary Care at [facility] [address] [place], [place] [address] Ph.  [phone] Fax [phone]

## 2020-06-17 LAB — COMPREHENSIVE METABOLIC PANEL
ALT: 52 IU/L — ABNORMAL HIGH (ref 0–32)
AST: 33 IU/L (ref 0–40)
Albumin/Globulin Ratio: 1.8 (ref 1.2–2.2)
Albumin: 4.6 g/dL (ref 3.9–5.0)
Alkaline Phosphatase: 79 IU/L (ref 48–121)
BUN/Creatinine Ratio: 14 (ref 9–23)
BUN: 10 mg/dL (ref 6–20)
Bilirubin Total: 0.4 mg/dL (ref 0.0–1.2)
CO2: 23 mmol/L (ref 20–29)
Calcium: 9.2 mg/dL (ref 8.7–10.2)
Chloride: 104 mmol/L (ref 96–106)
Creatinine, Ser: 0.72 mg/dL (ref 0.57–1.00)
GFR calc Af Amer: 137 mL/min/{1.73_m2} (ref >59)
GFR calc non Af Amer: 118 mL/min/{1.73_m2} (ref >59)
Globulin, Total: 2.6 g/dL (ref 1.5–4.5)
Glucose: 95 mg/dL (ref 65–99)
Potassium: 4.4 mmol/L (ref 3.5–5.2)
Sodium: 139 mmol/L (ref 134–144)
Total Protein: 7.2 g/dL (ref 6.0–8.5)

## 2020-06-17 LAB — TSH: TSH: 2.13 u[IU]/mL (ref 0.450–4.500)

## 2020-06-17 LAB — HEMOGLOBIN A1C
Est. average glucose Bld gHb Est-mCnc: 117 mg/dL
Hgb A1c MFr Bld: 5.7 % — ABNORMAL HIGH (ref 4.8–5.6)

## 2020-09-01 ENCOUNTER — Ambulatory Visit: Payer: No Typology Code available for payment source | Admitting: Family Medicine

## 2020-09-20 ENCOUNTER — Encounter: Payer: No Typology Code available for payment source | Admitting: Family Medicine

## 2020-10-19 ENCOUNTER — Other Ambulatory Visit: Payer: Self-pay

## 2020-10-19 ENCOUNTER — Telehealth (INDEPENDENT_AMBULATORY_CARE_PROVIDER_SITE_OTHER): Payer: HRSA Program | Admitting: Family Medicine

## 2020-10-19 ENCOUNTER — Encounter: Payer: Self-pay | Admitting: Family Medicine

## 2020-10-19 VITALS — Temp 97.4°F

## 2020-10-19 DIAGNOSIS — R059 Cough, unspecified: Secondary | ICD-10-CM | POA: Diagnosis not present

## 2020-10-19 DIAGNOSIS — U071 COVID-19: Secondary | ICD-10-CM | POA: Diagnosis not present

## 2020-10-19 MED ORDER — MUCINEX DM MAXIMUM STRENGTH 60-1200 MG PO TB12: 1.0000 | ORAL_TABLET | Freq: Two times a day (BID) | ORAL | 0 refills | Status: DC

## 2020-10-19 MED ORDER — GUAIFENESIN-CODEINE 100-10 MG/5ML PO SOLN: 5.0000 mL | Freq: Every day | ORAL | 0 refills | Status: AC

## 2020-10-19 MED ORDER — BENZONATATE 100 MG PO CAPS: 100.0000 mg | ORAL_CAPSULE | Freq: Three times a day (TID) | ORAL | 0 refills | Status: DC | PRN

## 2020-10-19 NOTE — Patient Instructions (Addendum)
COVID-19 COVID-19 is a respiratory infection that is caused by a virus called severe acute respiratory syndrome coronavirus 2 (SARS-CoV-2). The disease is also known as coronavirus disease or novel coronavirus. In some people, the virus may not cause any symptoms. In others, it may cause a serious infection. The infection can get worse quickly and can lead to complications, such as:  Pneumonia, or infection of the lungs.  Acute respiratory distress syndrome or ARDS. This is a condition in which fluid build-up in the lungs prevents the lungs from filling with air and passing oxygen into the blood.  Acute respiratory failure. This is a condition in which there is not enough oxygen passing from the lungs to the body or when carbon dioxide is not passing from the lungs out of the body.  Sepsis or septic shock. This is a serious bodily reaction to an infection.  Blood clotting problems.  Secondary infections due to bacteria or fungus.  Organ failure. This is when your body's organs stop working. The virus that causes COVID-19 is contagious. This means that it can spread from person to person through droplets from coughs and sneezes (respiratory secretions). What are the causes? This illness is caused by a virus. You may catch the virus by:  Breathing in droplets from an infected person. Droplets can be spread by a person breathing, speaking, singing, coughing, or sneezing.  Touching something, like a table or a doorknob, that was exposed to the virus (contaminated) and then touching your mouth, nose, or eyes. What increases the risk? Risk for infection You are more likely to be infected with this virus if you:  Are within 6 feet (2 meters) of a person with COVID-19.  Provide care for or live with a person who is infected with COVID-19.  Spend time in crowded indoor spaces or live in shared housing. Risk for serious illness You are more likely to become seriously ill from the virus  if you:  Are 25 years of age or older. The higher your age, the more you are at risk for serious illness.  Live in a nursing home or long-term care facility.  Have cancer.  Have a long-term (chronic) disease such as: ? Chronic lung disease, including chronic obstructive pulmonary disease or asthma. ? A long-term disease that lowers your body's ability to fight infection (immunocompromised). ? Heart disease, including heart failure, a condition in which the arteries that lead to the heart become narrow or blocked (coronary artery disease), a disease which makes the heart muscle thick, weak, or stiff (cardiomyopathy). ? Diabetes. ? Chronic kidney disease. ? Sickle cell disease, a condition in which red blood cells have an abnormal "sickle" shape. ? Liver disease.  Are obese. What are the signs or symptoms? Symptoms of this condition can range from mild to severe. Symptoms may appear any time from 2 to 14 days after being exposed to the virus. They include:  A fever or chills.  A cough.  Difficulty breathing.  Headaches, body aches, or muscle aches.  Runny or stuffy (congested) nose.  A sore throat.  New loss of taste or smell. Some people may also have stomach problems, such as nausea, vomiting, or diarrhea. Other people may not have any symptoms of COVID-19. How is this diagnosed? This condition may be diagnosed based on:  Your signs and symptoms, especially if: ? You live in an area with a COVID-19 outbreak. ? You recently traveled to or from an area where the virus is common. ?  You provide care for or live with a person who was diagnosed with COVID-19. ? You were exposed to a person who was diagnosed with COVID-19.  A physical exam.  Lab tests, which may include: ? Taking a sample of fluid from the back of your nose and throat (nasopharyngeal fluid), your nose, or your throat using a swab. ? A sample of mucus from your lungs (sputum). ? Blood tests.  Imaging  tests, which may include, X-rays, CT scan, or ultrasound. How is this treated? At present, there is no medicine to treat COVID-19. Medicines that treat other diseases are being used on a trial basis to see if they are effective against COVID-19. Your health care provider will talk with you about ways to treat your symptoms. For most people, the infection is mild and can be managed at home with rest, fluids, and over-the-counter medicines. Treatment for a serious infection usually takes places in a hospital intensive care unit (ICU). It may include one or more of the following treatments. These treatments are given until your symptoms improve.  Receiving fluids and medicines through an IV.  Supplemental oxygen. Extra oxygen is given through a tube in the nose, a face mask, or a hood.  Positioning you to lie on your stomach (prone position). This makes it easier for oxygen to get into the lungs.  Continuous positive airway pressure (CPAP) or bi-level positive airway pressure (BPAP) machine. This treatment uses mild air pressure to keep the airways open. A tube that is connected to a motor delivers oxygen to the body.  Ventilator. This treatment moves air into and out of the lungs by using a tube that is placed in your windpipe.  Tracheostomy. This is a procedure to create a hole in the neck so that a breathing tube can be inserted.  Extracorporeal membrane oxygenation (ECMO). This procedure gives the lungs a chance to recover by taking over the functions of the heart and lungs. It supplies oxygen to the body and removes carbon dioxide. Follow these instructions at home: Lifestyle  If you are sick, stay home except to get medical care. Your health care provider will tell you how long to stay home. Call your health care provider before you go for medical care.  Rest at home as told by your health care provider.  Do not use any products that contain nicotine or tobacco, such as cigarettes,  e-cigarettes, and chewing tobacco. If you need help quitting, ask your health care provider.  Return to your normal activities as told by your health care provider. Ask your health care provider what activities are safe for you. General instructions  Take over-the-counter and prescription medicines only as told by your health care provider.  Drink enough fluid to keep your urine pale yellow.  Keep all follow-up visits as told by your health care provider. This is important. How is this prevented?  There is no vaccine to help prevent COVID-19 infection. However, there are steps you can take to protect yourself and others from this virus. To protect yourself:   Do not travel to areas where COVID-19 is a risk. The areas where COVID-19 is reported change often. To identify high-risk areas and travel restrictions, check the CDC travel website: FatFares.com.br  If you live in, or must travel to, an area where COVID-19 is a risk, take precautions to avoid infection. ? Stay away from people who are sick. ? Wash your hands often with soap and water for 20 seconds. If soap and  water are not available, use an alcohol-based hand sanitizer. ? Avoid touching your mouth, face, eyes, or nose. ? Avoid going out in public, follow guidance from your state and local health authorities. ? If you must go out in public, wear a cloth face covering or face mask. Make sure your mask covers your nose and mouth. ? Avoid crowded indoor spaces. Stay at least 6 feet (2 meters) away from others. ? Disinfect objects and surfaces that are frequently touched every day. This may include:  Counters and tables.  Doorknobs and light switches.  Sinks and faucets.  Electronics, such as phones, remote controls, keyboards, computers, and tablets. To protect others: If you have symptoms of COVID-19, take steps to prevent the virus from spreading to others.  If you think you have a COVID-19 infection, contact  your health care provider right away. Tell your health care team that you think you may have a COVID-19 infection.  Stay home. Leave your house only to seek medical care. Do not use public transport.  Do not travel while you are sick.  Wash your hands often with soap and water for 20 seconds. If soap and water are not available, use alcohol-based hand sanitizer.  Stay away from other members of your household. Let healthy household members care for children and pets, if possible. If you have to care for children or pets, wash your hands often and wear a mask. If possible, stay in your own room, separate from others. Use a different bathroom.  Make sure that all people in your household wash their hands well and often.  Cough or sneeze into a tissue or your sleeve or elbow. Do not cough or sneeze into your hand or into the air.  Wear a cloth face covering or face mask. Make sure your mask covers your nose and mouth. Where to find more information  Centers for Disease Control and Prevention: www.cdc.gov/coronavirus/2019-ncov/index.html  World Health Organization: www.who.int/health-topics/coronavirus Contact a health care provider if:  You live in or have traveled to an area where COVID-19 is a risk and you have symptoms of the infection.  You have had contact with someone who has COVID-19 and you have symptoms of the infection. Get help right away if:  You have trouble breathing.  You have pain or pressure in your chest.  You have confusion.  You have bluish lips and fingernails.  You have difficulty waking from sleep.  You have symptoms that get worse. These symptoms may represent a serious problem that is an emergency. Do not wait to see if the symptoms will go away. Get medical help right away. Call your local emergency services (911 in the [place] Do not drive yourself to the hospital. Let the emergency medical personnel know if you think you have  COVID-19. Summary  COVID-19 is a respiratory infection that is caused by a virus. It is also known as coronavirus disease or novel coronavirus. It can cause serious infections, such as pneumonia, acute respiratory distress syndrome, acute respiratory failure, or sepsis.  The virus that causes COVID-19 is contagious. This means that it can spread from person to person through droplets from breathing, speaking, singing, coughing, or sneezing.  You are more likely to develop a serious illness if you are 50 years of age or older, have a weak immune system, live in a nursing home, or have chronic disease.  There is no medicine to treat COVID-19. Your health care provider will talk with you about ways to treat your   symptoms.  Take steps to protect yourself and others from infection. Wash your hands often and disinfect objects and surfaces that are frequently touched every day. Stay away from people who are sick and wear a mask if you are sick. This information is not intended to replace advice given to you by your health care provider. Make sure you discuss any questions you have with your health care provider. Document Revised: 07/30/2019 Document Reviewed: 11/05/2018 Elsevier Patient Education  2020 Elsevier Inc.   If you have lab work done today you will be contacted with your lab results within the next 2 weeks.  If you have not heard from us then please contact us. The fastest way to get your results is to register for My Chart.   IF you received an x-ray today, you will receive an invoice from [facility]. Please contact [facility] at [phone] with questions or concerns regarding your invoice.   IF you received labwork today, you will receive an invoice from [facility]. Please contact [facility] at [phone] with questions or concerns regarding your invoice.   Our billing staff will not be able to assist you with questions regarding bills from these companies.  You  will be contacted with the lab results as soon as they are available. The fastest way to get your results is to activate your My Chart account. Instructions are located on the last page of this paperwork. If you have not heard from us regarding the results in 2 weeks, please contact this office.      

## 2020-10-19 NOTE — Progress Notes (Signed)
Virtual Visit Note  I connected with patient on 10/19/20 at 1616 by telephone due to unable to work Epic video visit and verified that I am speaking with the correct person using two identifiers. Joanne Perez is currently located at home and no family members are currently with them during visit. The provider, Azalee Course Kem Hensen, FNP is located in their office at time of visit.  I discussed the limitations, risks, security and privacy concerns of performing an evaluation and management service by telephone and the availability of in person appointments. I also discussed with the patient that there may be a patient responsible charge related to this service. The patient expressed understanding and agreed to proceed.   I provided 20 minutes of non-face-to-face time during this encounter.  Chief Complaint  Patient presents with  . Cough    Chest congestion, green mucous, coughing at night keeping her up - ibuprofen   . Covid Positive    HPI ? Up to date with covid vaccinations On Monday noticed sore throat and headache Tuesday had no voice and couldn't talk Tested for Covid on Monday and was Negative Retested on Tuesday and was positive today Now has a cough with green mucus and more congestion    No Known Allergies  Prior to Admission medications   Medication Sig Start Date End Date Taking? Authorizing Provider  cetirizine (ZYRTEC ALLERGY) 10 MG tablet Take 1 tablet (10 mg total) by mouth daily. 04/09/20  Yes Wallis Bamberg, PA-C  ibuprofen (ADVIL) 800 MG tablet Take 1 tablet (800 mg total) by mouth every 8 (eight) hours as needed for cramping. 06/16/20  Yes Lezlie Lye, Meda Coffee, MD  Levonorgestrel-Ethinyl Estradiol (AMETHIA) 0.15-0.03 &0.01 MG tablet Take 1 tablet by mouth daily. 06/16/20  Yes Lezlie Lye, Meda Coffee, MD  sertraline (ZOLOFT) 50 MG tablet Take 1 tablet (50 mg total) by mouth daily. 06/16/20  Yes Lezlie Lye, Meda Coffee, MD  pseudoephedrine (SUDAFED) 60 MG tablet Take 1  tablet (60 mg total) by mouth every 8 (eight) hours as needed for congestion. Patient not taking: Reported on 10/19/2020 04/09/20   Wallis Bamberg, PA-C    Past Medical History:  Diagnosis Date  . Anxiety    Phreesia 03/21/2020  . Depression    Phreesia 03/21/2020    History reviewed. No pertinent surgical history.  Social History   Tobacco Use  . Smoking status: Never Smoker  . Smokeless tobacco: Never Used  Substance Use Topics  . Alcohol use: Yes    Alcohol/week: 1.0 standard drink    Types: 1 Cans of beer per week    Family History  Problem Relation Age of Onset  . Diabetes Father   . Diabetes Maternal Grandmother   . Diabetes Maternal Grandfather   . Diabetes Paternal Grandmother     Review of Systems  Constitutional: Negative for chills, fever and malaise/fatigue.  HENT: Positive for congestion and sore throat. Negative for sinus pain.   Respiratory: Positive for cough and sputum production. Negative for shortness of breath, wheezing and stridor.   Gastrointestinal: Positive for diarrhea. Negative for constipation, heartburn, nausea and vomiting.  Musculoskeletal: Positive for back pain. Negative for myalgias.  Skin: Negative for rash.    Objective  Constitutional:      General: Not in acute distress.    Appearance: Normal appearance. Not ill-appearing.   Pulmonary:     Effort: Pulmonary effort is normal. No respiratory distress.  Neurological:     Mental Status: Alert and oriented  to person, place, and time.  Psychiatric:        Mood and Affect: Mood normal.        Behavior: Behavior normal.     ASSESSMENT and PLAN  Problem List Items Addressed This Visit   None   Visit Diagnoses    COVID-19    -  Primary   Cough       Relevant Medications   benzonatate (TESSALON) 100 MG capsule   Dextromethorphan-guaiFENesin (MUCINEX DM MAXIMUM STRENGTH) 60-1200 MG TB12   guaiFENesin-codeine 100-10 MG/5ML syrup     Discussed conservative treatment of  symptoms RTC/ED precautions provided Work note provided and answered all questions R/se/b of medications discussed.  Return if symptoms worsen or fail to improve.    The above assessment and management plan was discussed with the patient. The patient verbalized understanding of and has agreed to the management plan. Patient is aware to call the clinic if symptoms persist or worsen. Patient is aware when to return to the clinic for a follow-up visit. Patient educated on when it is appropriate to go to the emergency department.     Macario Carls Mekia Dipinto, FNP-BC Primary Care at [facility] [address] [place], [place] [address] Ph.  [phone] Fax [phone]

## 2020-12-21 ENCOUNTER — Ambulatory Visit: Payer: No Typology Code available for payment source | Admitting: Physician Assistant

## 2021-01-17 ENCOUNTER — Encounter: Payer: Self-pay | Admitting: Family Medicine

## 2021-01-29 ENCOUNTER — Ambulatory Visit: Payer: No Typology Code available for payment source | Admitting: Physician Assistant

## 2021-02-11 DIAGNOSIS — O039 Complete or unspecified spontaneous abortion without complication: Secondary | ICD-10-CM

## 2021-02-11 HISTORY — DX: Complete or unspecified spontaneous abortion without complication: O03.9

## 2021-02-14 ENCOUNTER — Emergency Department (HOSPITAL_COMMUNITY): Payer: 59

## 2021-02-14 ENCOUNTER — Encounter (HOSPITAL_COMMUNITY): Payer: Self-pay

## 2021-02-14 ENCOUNTER — Emergency Department (HOSPITAL_COMMUNITY)
Admission: EM | Admit: 2021-02-14 | Discharge: 2021-02-14 | Disposition: A | Discharge status: Home / Self Care | Payer: 59 | Attending: Emergency Medicine | Admitting: Emergency Medicine

## 2021-02-14 ENCOUNTER — Other Ambulatory Visit: Payer: Self-pay

## 2021-02-14 DIAGNOSIS — Z3A00 Weeks of gestation of pregnancy not specified: Secondary | ICD-10-CM | POA: Diagnosis not present

## 2021-02-14 DIAGNOSIS — R1031 Right lower quadrant pain: Secondary | ICD-10-CM | POA: Diagnosis not present

## 2021-02-14 DIAGNOSIS — O26899 Other specified pregnancy related conditions, unspecified trimester: Secondary | ICD-10-CM | POA: Insufficient documentation

## 2021-02-14 DIAGNOSIS — Z3A01 Less than 8 weeks gestation of pregnancy: Secondary | ICD-10-CM | POA: Diagnosis not present

## 2021-02-14 DIAGNOSIS — R1032 Left lower quadrant pain: Secondary | ICD-10-CM | POA: Insufficient documentation

## 2021-02-14 DIAGNOSIS — R1030 Lower abdominal pain, unspecified: Secondary | ICD-10-CM

## 2021-02-14 DIAGNOSIS — R102 Pelvic and perineal pain: Secondary | ICD-10-CM

## 2021-02-14 DIAGNOSIS — O26891 Other specified pregnancy related conditions, first trimester: Secondary | ICD-10-CM | POA: Diagnosis not present

## 2021-02-14 DIAGNOSIS — Z3201 Encounter for pregnancy test, result positive: Secondary | ICD-10-CM

## 2021-02-14 LAB — ABO/RH: ABO/RH(D): O POS

## 2021-02-14 LAB — CBC WITH DIFFERENTIAL/PLATELET
Abs Immature Granulocytes: 0.04 10*3/uL (ref 0.00–0.07)
Basophils Absolute: 0.1 10*3/uL (ref 0.0–0.1)
Basophils Relative: 1 %
Eosinophils Absolute: 0.2 10*3/uL (ref 0.0–0.5)
Eosinophils Relative: 2 %
HCT: 38.6 % (ref 36.0–46.0)
Hemoglobin: 11.9 g/dL — ABNORMAL LOW (ref 12.0–15.0)
Immature Granulocytes: 0 %
Lymphocytes Relative: 34 %
Lymphs Abs: 3.1 10*3/uL (ref 0.7–4.0)
MCH: 25.4 pg — ABNORMAL LOW (ref 26.0–34.0)
MCHC: 30.8 g/dL (ref 30.0–36.0)
MCV: 82.3 fL (ref 80.0–100.0)
Monocytes Absolute: 0.4 10*3/uL (ref 0.1–1.0)
Monocytes Relative: 5 %
Neutro Abs: 5.3 10*3/uL (ref 1.7–7.7)
Neutrophils Relative %: 58 %
Platelets: 239 10*3/uL (ref 150–400)
RBC: 4.69 MIL/uL (ref 3.87–5.11)
RDW: 14.9 % (ref 11.5–15.5)
WBC: 9.1 10*3/uL (ref 4.0–10.5)
nRBC: 0 % (ref 0.0–0.2)

## 2021-02-14 LAB — BASIC METABOLIC PANEL
Anion gap: 7 (ref 5–15)
BUN: 13 mg/dL (ref 6–20)
CO2: 23 mmol/L (ref 22–32)
Calcium: 8.8 mg/dL — ABNORMAL LOW (ref 8.9–10.3)
Chloride: 108 mmol/L (ref 98–111)
Creatinine, Ser: 0.7 mg/dL (ref 0.44–1.00)
GFR, Estimated: >60 mL/min (ref >60)
Glucose, Bld: 142 mg/dL — ABNORMAL HIGH (ref 70–99)
Potassium: 3.5 mmol/L (ref 3.5–5.1)
Sodium: 138 mmol/L (ref 135–145)

## 2021-02-14 LAB — HCG, QUANTITATIVE, PREGNANCY: hCG, Beta Chain, Quant, S: 387 m[IU]/mL — ABNORMAL HIGH (ref <5)

## 2021-02-14 LAB — I-STAT BETA HCG BLOOD, ED (MC, WL, AP ONLY): I-stat hCG, quantitative: 360.8 m[IU]/mL — ABNORMAL HIGH (ref <5)

## 2021-02-14 NOTE — ED Triage Notes (Signed)
+   pregnancy test Saturday. Patient reports yesterday constant RLQ abd pain and left flank pain with brown discharge.

## 2021-02-14 NOTE — ED Provider Notes (Signed)
[facility]-EMERGENCY DEPT Provider Note   CSN: [id number] Arrival date & time: 02/14/21  1846     History Chief Complaint  Patient presents with  . Abdominal Pain    Joanne Perez is a 25 y.o. female G1, P0, presenting for evaluation of abdominal pain in the setting of a positive pregnancy test on Saturday at home.  She states yesterday she noticed some lower abdominal pain that she initially felt like menstrual cramps.  Worsened today is a little bit more sharp in nature in the right lower quadrant.  She also noticed some brownish vaginal discharge today.  She also has some left mid back pain.  Denies urinary symptoms, fevers, lightheadedness.  This is her first pregnancy.  LMP was 12/26/2020.  It is usually irregular.  She is not followed by OB, however does have a future appointment with [facility].  The history is provided by the patient.       Past Medical History:  Diagnosis Date  . Anxiety    Phreesia 03/21/2020  . Depression    Phreesia 03/21/2020    There are no problems to display for this patient.   History reviewed. No pertinent surgical history.   OB History    Gravida  1   Para      Term      Preterm      AB      Living        SAB      IAB      Ectopic      Multiple      Live Births              Family History  Problem Relation Age of Onset  . Diabetes Father   . Diabetes Maternal Grandmother   . Diabetes Maternal Grandfather   . Diabetes Paternal Grandmother     Social History   Tobacco Use  . Smoking status: Never Smoker  . Smokeless tobacco: Never Used  Vaping Use  . Vaping Use: Never used  Substance Use Topics  . Alcohol use: Yes    Alcohol/week: 1.0 standard drink    Types: 1 Cans of beer per week  . Drug use: No    Home Medications Prior to Admission medications   Medication Sig Start Date End Date Taking? Authorizing Provider  cetirizine (ZYRTEC ALLERGY) 10 MG tablet Take 1  tablet (10 mg total) by mouth daily. Patient taking differently: Take 10 mg by mouth daily as needed for allergies. 04/09/20  Yes Wallis Bamberg, PA-C  ibuprofen (ADVIL) 800 MG tablet Take 1 tablet (800 mg total) by mouth every 8 (eight) hours as needed for cramping. Patient taking differently: Take 800 mg by mouth every 8 (eight) hours as needed for cramping or mild pain. 06/16/20  Yes Lezlie Lye, Meda Coffee, MD  Prenatal Vit-Fe Fumarate-FA (PRENATAL MULTIVITAMIN) TABS tablet Take 1 tablet by mouth daily at 12 noon.   Yes [provider]  benzonatate (TESSALON) 100 MG capsule Take 1-2 capsules (100-200 mg total) by mouth 3 (three) times daily as needed for cough. Patient not taking: No sig reported 10/19/20   Just, Azalee Course, FNP  Dextromethorphan-guaiFENesin (MUCINEX DM MAXIMUM STRENGTH) 60-1200 MG TB12 Take 1 tablet by mouth every 12 (twelve) hours. Patient not taking: No sig reported 10/19/20   Just, Azalee Course, FNP  Levonorgestrel-Ethinyl Estradiol (AMETHIA) 0.15-0.03 &0.01 MG tablet Take 1 tablet by mouth daily. Patient not taking: No sig reported 06/16/20  Lezlie Lye, Meda Coffee, MD  pseudoephedrine (SUDAFED) 60 MG tablet Take 1 tablet (60 mg total) by mouth every 8 (eight) hours as needed for congestion. Patient not taking: No sig reported 04/09/20   Wallis Bamberg, PA-C  sertraline (ZOLOFT) 50 MG tablet Take 1 tablet (50 mg total) by mouth daily. Patient not taking: No sig reported 06/16/20   Lezlie Lye, Meda Coffee, MD    Allergies    Patient has no known allergies.  Review of Systems   Review of Systems  Gastrointestinal: Positive for abdominal pain.  Genitourinary: Positive for vaginal bleeding.  All other systems reviewed and are negative.   Physical Exam Updated Vital Signs BP 115/77   Pulse 98   Temp 98.9 F (37.2 C) (Oral)   Resp 18   Ht 5\' 3"  (1.6 m)   Wt 73.9 kg   LMP 12/26/2020   SpO2 99%   BMI 28.87 kg/m   Physical Exam Vitals and nursing note reviewed.   Constitutional:      Appearance: She is well-developed. She is not ill-appearing.  HENT:     Head: Normocephalic and atraumatic.  Eyes:     Conjunctiva/sclera: Conjunctivae normal.  Cardiovascular:     Rate and Rhythm: Normal rate and regular rhythm.  Pulmonary:     Effort: Pulmonary effort is normal.     Breath sounds: Normal breath sounds.  Abdominal:     General: Bowel sounds are normal.     Palpations: Abdomen is soft.     Tenderness: There is abdominal tenderness in the left lower quadrant. There is no guarding or rebound.  Skin:    General: Skin is warm.  Neurological:     Mental Status: She is alert.  Psychiatric:        Behavior: Behavior normal.     ED Results / Procedures / Treatments   Labs (all labs ordered are listed, but only abnormal results are displayed) Labs Reviewed  CBC WITH DIFFERENTIAL/PLATELET - Abnormal; Notable for the following components:      Result Value   Hemoglobin 11.9 (*)    MCH 25.4 (*)    All other components within normal limits  BASIC METABOLIC PANEL - Abnormal; Notable for the following components:   Glucose, Bld 142 (*)    Calcium 8.8 (*)    All other components within normal limits  HCG, QUANTITATIVE, PREGNANCY - Abnormal; Notable for the following components:   hCG, Beta Chain, Quant, S 387 (*)    All other components within normal limits  I-STAT BETA HCG BLOOD, ED ([facility], [facility], [facility] ONLY) - Abnormal; Notable for the following components:   I-stat hCG, quantitative 360.8 (*)    All other components within normal limits  URINALYSIS, ROUTINE W REFLEX MICROSCOPIC  ABO/RH    EKG None  Radiology 12/28/2020 OB LESS THAN 14 WEEKS WITH OB TRANSVAGINAL  Result Date: 02/14/2021 CLINICAL DATA:  Right pelvic pain EXAM: OBSTETRIC <14 WK 04/16/2021 AND TRANSVAGINAL OB US TECHNIQUE: Both transabdominal and transvaginal ultrasound examinations were performed for complete evaluation of the gestation as well as the maternal uterus, adnexal regions, and pelvic  cul-de-sac. Transvaginal technique was performed to assess early pregnancy. COMPARISON:  None. FINDINGS: Intrauterine gestational sac: Not seen Yolk sac:  Not seen Embryo:  Not seen Maternal uterus/adnexae: Right ovary is within normal limits and measures 2.1 x 2.5 x 2 cm. The left ovary measures 2.4 x 1.6 by 1.2 cm. Within the left adnexa, separate from the left ovary is a hypoechoic  masslike region measuring 2.5 x 1.6 by 2 cm. Small free fluid in the pelvis. IMPRESSION: 1. No IUP identified. 2.5 cm hypoechoic masslike region in the left adnexa is indeterminate for ectopic pregnancy though pain is reportedly on the right side. HCG trending and repeat ultrasound might be helpful for further clarification. Small free fluid in the pelvis. 2. Critical Value/emergent results were called by telephone at the time of interpretation on 02/14/2021 at 8:50 pm to provider [facility] , who verbally acknowledged these results. Electronically Signed   By: Jasmine Pang M.D.   On: 02/14/2021 20:50    Procedures Procedures   Medications Ordered in ED Medications - No data to display  ED Course  I have reviewed the triage vital signs and the nursing notes.  Pertinent labs & imaging results that were available during my care of the patient were reviewed by me and considered in my medical decision making (see chart for details).  Clinical Course as of 02/14/21 2335  Wed Feb 14, 2021  2213 Consulted OB Dr. Despina Hidden.  He reviewed the ultrasound images and suggests its more reassuring, there is no "ring of fire, no free fluid.  Recommends patient report to the MAU Saturday morning for repeat HCG. appreciate consultation [JR]    Clinical Course User Index [JR] Tijana Walder, [place] N, PA-C   MDM Rules/Calculators/A&P                          Patient is a 25 year old female, G1 P0 presenting for evaluation of abdominal pain.  hCG is 387 today.  LMP is March 15, however endorses irregular menstrual cycle.  Ultrasound shows  hypoechoic mass on left adnexa, no IUP.  Blood work otherwise is reassuring.  Discussed with OB physician, Dr. Despina Hidden.  He reviewed images.  Does not believe ultrasound is consistent with ectopic at this time.  Recommends patient follows in the MAU on Saturday morning for repeat hCG.  Also suggests pregnancy is likely too early to see IUP.  Appreciate consultation and recommendations.  Discussed these findings with patient and recommendations for close follow-up.  Return precautions also discussed.  Answered all questions to the best my ability.  She is discharged in no acute distress.  Discussed results, findings, treatment and follow up. Patient advised of return precautions. Patient verbalized understanding and agreed with plan.  Final Clinical Impression(s) / ED Diagnoses Final diagnoses:  Lower abdominal pain  Positive blood pregnancy test    Rx / DC Orders ED Discharge Orders    None       Akoni Parton, [place] N, PA-C 02/14/21 2335    Rolan Bucco, MD 02/15/21 [phone]

## 2021-02-14 NOTE — Discharge Instructions (Signed)
Please go to the maternity assessment unit on Saturday morning for recheck of your hCG hormone level.  This is at [facility] at entrance C, follow signs for labor and delivery.  If you have any concerning or worsening symptoms between now and then, you can also report to the maternity assessment unit for urgent evaluation.

## 2021-02-14 NOTE — ED Provider Notes (Signed)
Emergency Medicine Provider Triage Evaluation Note  Joanne Perez , a 25 y.o. female  was evaluated in triage.  Pt complains of lower abdominal pain.  Patient had a positive home pregnancy test on Saturday, yesterday started having persistent right lower quadrant abdominal pain that is described as a dull ache.  Today had some very light pink and then some brown discharge, but no gross blood.  Has not had any OB care yet.  LMP March 15  Review of Systems  Positive: Abdominal pain, vaginal discharge Negative: bleeding  Physical Exam  BP 138/73 (BP Location: Left Arm)   Pulse (!) 102   Temp 98.9 F (37.2 C) (Oral)   Resp 16   Ht 5\' 3"  (1.6 m)   Wt 73.9 kg   LMP 12/26/2020   SpO2 96%   BMI 28.87 kg/m  Gen:   Awake, no distress   Resp:  Normal effort  MSK:   Moves extremities without difficulty  Other:  Abdomen with very mild right lower quadrant tenderness  Medical Decision Making  Medically screening exam initiated at 6:58 PM.  Appropriate orders placed.  12/28/2020 was informed that the remainder of the evaluation will be completed by another provider, this initial triage assessment does not replace that evaluation, and the importance of remaining in the ED until their evaluation is complete.     Francesca Oman, PA-C 02/14/21 1906    04/16/21, MD 02/15/21 [phone]

## 2021-02-16 ENCOUNTER — Ambulatory Visit: Payer: No Typology Code available for payment source | Admitting: Physician Assistant

## 2021-02-17 ENCOUNTER — Encounter (HOSPITAL_COMMUNITY): Payer: Self-pay | Admitting: Obstetrics & Gynecology

## 2021-02-17 ENCOUNTER — Other Ambulatory Visit: Payer: Self-pay

## 2021-02-17 ENCOUNTER — Inpatient Hospital Stay (HOSPITAL_COMMUNITY)
Admission: AD | Admit: 2021-02-17 | Discharge: 2021-02-17 | Disposition: A | Discharge status: Home / Self Care | Payer: 59 | Attending: Obstetrics & Gynecology | Admitting: Obstetrics & Gynecology

## 2021-02-17 DIAGNOSIS — O3680X0 Pregnancy with inconclusive fetal viability, not applicable or unspecified: Secondary | ICD-10-CM | POA: Insufficient documentation

## 2021-02-17 DIAGNOSIS — Z3A01 Less than 8 weeks gestation of pregnancy: Secondary | ICD-10-CM | POA: Diagnosis not present

## 2021-02-17 LAB — HCG, QUANTITATIVE, PREGNANCY: hCG, Beta Chain, Quant, S: 512 m[IU]/mL — ABNORMAL HIGH (ref <5)

## 2021-02-17 NOTE — MAU Note (Signed)
Pt reports to mau for follow up hcg level.  Pt denies any pain or bleeding today.

## 2021-02-17 NOTE — Discharge Instructions (Signed)
Ectopic Pregnancy  An ectopic pregnancy happens when a fertilized egg grows outside of the womb (uterus). Fertilized means that sperm entered the egg. The egg cannot stay alive outside of the womb. What are the causes? The most common cause is damage to a fallopian tube. This damage stops the egg from getting to the womb. Instead, the egg stays in the tube. Sometimes, an ectopic pregnancy happens in other parts of the body. What increases the risk?  Getting treatment before to help you have a baby.  A past pregnancy outside of the womb.  A past surgery to have your tubes tied.  Getting pregnant while using a device in the womb to avoid getting pregnant.  Taking birth control pills before the age of 69.  Smoking or drinking alcohol.  Having a mother who took a medicine called DES many years ago. What are the signs or symptoms? Common symptoms of this condition include:  Missing a menstrual period.  Feeling like you may vomit.  Tiredness.  Breast pain.  Other signs that you are pregnant. Other symptoms may include:  Pain during sex.  Bleeding from the vagina.  Belly pain.  A fast heartbeat, low blood pressure, and sweating.  Pain or extra pressure while pooping (having a bowel movement). If your tube tears or bursts:  You may have sudden and very bad pain in your belly.  You may feel dizzy, weak, or light-headed.  You may faint.  You may have pain in your shoulder or neck. A torn or burst tube is an emergency. It can be life-threatening. How is this treated? This condition may be treated with:  Medicine. This may be given if: ? The pregnancy is found early and you are not bleeding. ? The tube has not torn or burst.  Surgery. This may be done to: ? Take out the pregnancy tissue. ? Stop bleeding. ? Take out part or all of the tube. ? Take out the womb. This is rare.  Blood tests.  Careful watching. Follow these instructions at home: Medicines  Take  over-the-counter and prescription medicines only as told by your doctor.  If told, take steps to prevent problems with pooping (constipation). You may need to: ? Drink enough fluid to keep your pee (urine) pale yellow. ? Take medicines. You will be told what medicines to take. ? Eat foods that are high in fiber. These include beans, whole grains, and fresh fruits and vegetables. ? Limit foods that are high in fat and sugar. These include fried or sweet foods.  Ask your doctor if you should avoid driving or using machines while you are taking your medicine. General instructions  Rest or limit your activities, if told to do this.  Do not have sex for 6 weeks or as told by your doctor.  Do not put tampons, vaginal cleaning wash (douche), or other things in your vagina. Do not use these things for 6 weeks or until your doctor says it is safe to use them.  Do not lift anything that is heavier than 10 lb (4.5 kg), or the limit that you are told.  Return to your normal activities when your doctor says that it is safe.  Keep all follow-up visits. Contact a doctor if:  You have a fever or chills.  You feel like you may vomit and you vomit. Get help right away if:  Your pain gets worse or is not helped by medicine.  You feel dizzy or weak.  You feel  light-headed.  You faint.  You have sudden and very bad pain in your belly.  You have very bad pain in your shoulder or neck. Summary  An ectopic pregnancy happens when a fertilized egg grows outside the womb.  This is an emergency.  The most common cause is damage to one of the fallopian tubes.  This condition may be treated with medicine, surgery, blood tests, or careful watching. This information is not intended to replace advice given to you by your health care provider. Make sure you discuss any questions you have with your health care provider. Document Revised: 01/21/2020 Document Reviewed: 01/11/2020 Elsevier Patient  Education  2021 [employer]  

## 2021-02-17 NOTE — MAU Provider Note (Signed)
Event Date/Time   First Provider Initiated Contact with Patient 02/17/21 [phone]     S Ms. SYBRINA LANING is a 25 y.o. G1P0 pregnant female at [redacted]w[redacted]d who presents to MAU today for repeat bHCG. She was seen at [facility] on 02/14/21 and evaluated for right sided cramping with a +HPT. [facility] did a full workup including a consult with Dr. Despina Hidden. She has no right or left sided pain, just very mild and occasional midline cramps. No vaginal bleeding or other physical symptoms.  O Pulse 85   Temp 98.2 F (36.8 C) (Oral)   Resp 16   Ht 5\' 3"  (1.6 m)   Wt 163 lb 6.4 oz (74.1 kg)   LMP 12/26/2020   SpO2 97%   BMI 28.95 kg/m  Physical Exam Vitals and nursing note reviewed.  Constitutional:      General: She is not in acute distress.    Appearance: Normal appearance. She is not ill-appearing.  HENT:     Head: Normocephalic and atraumatic.     Mouth/Throat:     Mouth: Mucous membranes are moist.  Eyes:     Pupils: Pupils are equal, round, and reactive to light.  Cardiovascular:     Rate and Rhythm: Normal rate.     Pulses: Normal pulses.  Pulmonary:     Effort: Pulmonary effort is normal.  Abdominal:     Palpations: Abdomen is soft.     Tenderness: There is no abdominal tenderness. There is no guarding.  Musculoskeletal:        General: Normal range of motion.  Skin:    General: Skin is warm and dry.     Capillary Refill: Capillary refill takes less than 2 seconds.  Neurological:     Mental Status: She is alert and oriented to person, place, and time.  Psychiatric:        Mood and Affect: Mood normal.        Behavior: Behavior normal.        Thought Content: Thought content normal.        Judgment: Judgment normal.    Results for orders placed or performed during the hospital encounter of 02/17/21 (from the past 24 hour(s))  hCG, quantitative, pregnancy     Status: Abnormal   Collection Time: 02/17/21 10:11 AM  Result Value Ref Range   hCG, Beta Chain, Quant, S 512 (H) <5 mIU/mL    Inappropriate rise, but desired pregnancy. Discussed findings with Dr. 04/19/21 who agreed patient can follow up for another bHCG on Monday at [facility]. Patient informed via telephone and appointment scheduled.  A Pregnancy of unknown location Medical screening exam complete  P Discharge from MAU in stable condition with ectopic precautions Pt to follow up at [facility] at 1:30pm for stat bHCG Warning signs for worsening condition that would warrant emergency follow-up discussed Patient may return to MAU as needed for emergent OB/GYN related complaints  [facility], CNM 02/17/2021 11:47 AM

## 2021-02-19 ENCOUNTER — Other Ambulatory Visit: Payer: Self-pay

## 2021-02-19 ENCOUNTER — Other Ambulatory Visit (INDEPENDENT_AMBULATORY_CARE_PROVIDER_SITE_OTHER): Payer: 59 | Admitting: *Deleted

## 2021-02-19 VITALS — BP 117/63 | HR 83

## 2021-02-19 DIAGNOSIS — O3680X0 Pregnancy with inconclusive fetal viability, not applicable or unspecified: Secondary | ICD-10-CM | POA: Diagnosis not present

## 2021-02-19 LAB — BETA HCG QUANT (REF LAB): hCG Quant: 215 m[IU]/mL

## 2021-02-19 NOTE — Progress Notes (Signed)
Pt presents for stat BHCG following visits to [facility] on 5/4 and MAU on 5/7. She reports having scant amount of pink/red vaginal bleeding yesterday. She has been having occasional mild abdominal cramps - unchanged from initial visit to [facility] on 5/4. Pt was advised that she will be called with results and recommended plan of care later today. She was instructed to go to MAU if she develops heavy vaginal bleeding or increased abdominal/pelvic pain. Pt voiced understanding.   1732  Dr. Shawnie Pons notified of BHCG result (215) and she stated this most likely represents a miscarriage. Order received for non-stat BHCG in 2 days. I called pt and informed her of results and recommended plan of care. Pt voiced understanding and agreed to lab appt on 5/11 @ 8:20 am.

## 2021-02-19 NOTE — Progress Notes (Signed)
Patient seen and assessed by nursing staff.  Agree with documentation and plan.  

## 2021-02-21 ENCOUNTER — Other Ambulatory Visit: Payer: Self-pay

## 2021-02-21 ENCOUNTER — Other Ambulatory Visit: Payer: 59

## 2021-02-21 DIAGNOSIS — O3680X0 Pregnancy with inconclusive fetal viability, not applicable or unspecified: Secondary | ICD-10-CM | POA: Diagnosis not present

## 2021-02-22 LAB — BETA HCG QUANT (REF LAB): hCG Quant: 99 m[IU]/mL

## 2021-03-14 DIAGNOSIS — O039 Complete or unspecified spontaneous abortion without complication: Secondary | ICD-10-CM | POA: Diagnosis not present

## 2021-03-26 ENCOUNTER — Ambulatory Visit (HOSPITAL_BASED_OUTPATIENT_CLINIC_OR_DEPARTMENT_OTHER): Payer: 59 | Admitting: Family Medicine

## 2021-03-27 ENCOUNTER — Ambulatory Visit (HOSPITAL_BASED_OUTPATIENT_CLINIC_OR_DEPARTMENT_OTHER): Payer: 59 | Admitting: Family Medicine

## 2021-03-29 ENCOUNTER — Ambulatory Visit (HOSPITAL_BASED_OUTPATIENT_CLINIC_OR_DEPARTMENT_OTHER): Payer: 59 | Admitting: Family Medicine

## 2021-03-30 ENCOUNTER — Other Ambulatory Visit: Payer: Self-pay

## 2021-03-30 ENCOUNTER — Ambulatory Visit (HOSPITAL_BASED_OUTPATIENT_CLINIC_OR_DEPARTMENT_OTHER): Payer: 59 | Admitting: Family Medicine

## 2021-03-30 ENCOUNTER — Encounter (HOSPITAL_BASED_OUTPATIENT_CLINIC_OR_DEPARTMENT_OTHER): Payer: Self-pay | Admitting: Family Medicine

## 2021-03-30 ENCOUNTER — Telehealth (HOSPITAL_BASED_OUTPATIENT_CLINIC_OR_DEPARTMENT_OTHER): Payer: Self-pay

## 2021-03-30 VITALS — BP 102/74 | HR 79 | Ht 63.0 in | Wt 167.2 lb

## 2021-03-30 DIAGNOSIS — Z131 Encounter for screening for diabetes mellitus: Secondary | ICD-10-CM | POA: Diagnosis not present

## 2021-03-30 DIAGNOSIS — R7303 Prediabetes: Secondary | ICD-10-CM

## 2021-03-30 DIAGNOSIS — Z600 Problems of adjustment to life-cycle transitions: Secondary | ICD-10-CM | POA: Insufficient documentation

## 2021-03-30 DIAGNOSIS — R21 Rash and other nonspecific skin eruption: Secondary | ICD-10-CM

## 2021-03-30 LAB — POCT GLYCOSYLATED HEMOGLOBIN (HGB A1C)
HbA1c POC (<> result, manual entry): 5.4 % (ref 4.0–5.6)
Hemoglobin A1C: 5.4 % (ref 4.0–5.6)

## 2021-03-30 NOTE — Assessment & Plan Note (Addendum)
Hemoglobin A1c 5.7% about 9 months ago Discussed appropriate lifestyle modifications likely dietary changes and gradual increase in physical activity with goal of about 150 minutes of moderate intensity aerobic exercise weekly Will recheck hemoglobin A1c today Discussed referral to nutritionist, patient wishes to hold off for now
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
## 2021-03-30 NOTE — Telephone Encounter (Signed)
Pt is aware and agreeable to results and plan

## 2021-03-30 NOTE — Patient Instructions (Signed)
  Medication Instructions:  Your physician recommends that you continue on your current medications as directed. Please refer to the Current Medication list given to you today. --If you need a refill on any your medications before your next appointment, please call your pharmacy first. If no refills are authorized on file call the office.-- Lab Work: Your physician has recommended that you have lab work today: A1C If you have labs (blood work) drawn today and your tests are completely normal, you will receive your results via MyChart message OR a phone call from our staff.  Please ensure you check your voicemail in the event that you authorized detailed messages to be left on a delegated number. If you have any lab test that is abnormal or we need to change your treatment, we will call you to review the results.  Follow-Up: Your next appointment:   Your physician recommends that you schedule a follow-up appointment in: 3-4 with Dr. de [place]  Thanks for letting us be apart of your health journey!!  [facility]   Dr. Ceasar Mons [place]   We encourage you to activate your patient portal called "MyChart".  Sign up information is provided on this After Visit Summary.  MyChart is used to connect with patients for Virtual Visits (Telemedicine).  Patients are able to view lab/test results, encounter notes, upcoming appointments, etc.  Non-urgent messages can be sent to your provider as well. To learn more about what you can do with MyChart, please visit --  [email or web address].

## 2021-03-30 NOTE — Assessment & Plan Note (Signed)
History of anxiety/depression, reports that she feels that she is doing fairly well currently PHQ-9 with a score of 8, GAD-7 with score of 13 Has been on sertraline in the past, stopped about 1 year ago Has had some increase in symptoms related to recent miscarriage Discussed consideration of initiating counseling, she will consider and let us know if she would like referral to be placed Monitor at follow-up visits, return to clinic if any worsening of symptoms

## 2021-03-30 NOTE — Assessment & Plan Note (Signed)
Uncertain etiology, may be related to underlying insulin resistance Advised that she continue with OTC topical therapies Recommend lifestyle modifications as discussed related to prediabetes Consider referral to dermatology, patient wishes to hold off for now

## 2021-03-30 NOTE — Telephone Encounter (Signed)
-----   Message from Hosie Poisson [place], MD sent at 03/30/2021 12:43 PM EDT ----- Hemoglobin A1c within normal range today.  Can plan to recheck in about 1 year.

## 2021-04-12 DIAGNOSIS — Z124 Encounter for screening for malignant neoplasm of cervix: Secondary | ICD-10-CM | POA: Diagnosis not present

## 2021-04-12 DIAGNOSIS — Z01419 Encounter for gynecological examination (general) (routine) without abnormal findings: Secondary | ICD-10-CM | POA: Diagnosis not present

## 2021-06-05 ENCOUNTER — Other Ambulatory Visit: Payer: Self-pay | Admitting: Podiatry

## 2021-06-05 MED ORDER — ONDANSETRON HCL 4 MG PO TABS: 4.0000 mg | ORAL_TABLET | Freq: Three times a day (TID) | ORAL | 0 refills | Status: AC | PRN

## 2021-07-02 ENCOUNTER — Ambulatory Visit (HOSPITAL_BASED_OUTPATIENT_CLINIC_OR_DEPARTMENT_OTHER): Payer: 59 | Admitting: Family Medicine

## 2021-07-26 ENCOUNTER — Ambulatory Visit (HOSPITAL_BASED_OUTPATIENT_CLINIC_OR_DEPARTMENT_OTHER): Payer: 59 | Admitting: Family Medicine

## 2021-07-27 ENCOUNTER — Encounter (HOSPITAL_BASED_OUTPATIENT_CLINIC_OR_DEPARTMENT_OTHER): Payer: Self-pay

## 2021-07-27 ENCOUNTER — Ambulatory Visit (HOSPITAL_BASED_OUTPATIENT_CLINIC_OR_DEPARTMENT_OTHER): Payer: 59 | Admitting: Family Medicine

## 2022-03-28 ENCOUNTER — Encounter (HOSPITAL_BASED_OUTPATIENT_CLINIC_OR_DEPARTMENT_OTHER): Payer: Self-pay

## 2022-03-28 ENCOUNTER — Encounter (HOSPITAL_BASED_OUTPATIENT_CLINIC_OR_DEPARTMENT_OTHER): Payer: 59 | Admitting: Family Medicine

## 2022-05-07 ENCOUNTER — Encounter (HOSPITAL_BASED_OUTPATIENT_CLINIC_OR_DEPARTMENT_OTHER): Payer: Self-pay | Admitting: Family Medicine

## 2022-06-07 IMAGING — US US OB < 14 WEEKS - US OB TV
1 series · 13 of 28 positions shown · non-contrast
Comparison: None.

CLINICAL DATA: Right pelvic pain

EXAM:
OBSTETRIC <14 WK US AND TRANSVAGINAL OB US
TECHNIQUE: Both transabdominal and transvaginal ultrasound examinations were
performed for complete evaluation of the gestation as well as the
maternal uterus, adnexal regions, and pelvic cul-de-sac.
Transvaginal technique was performed to assess early pregnancy.

[Series 1: us ob < 14 weeks - us ob tv · 103 acquisitions, 13 frames shown]
[im 4/103]
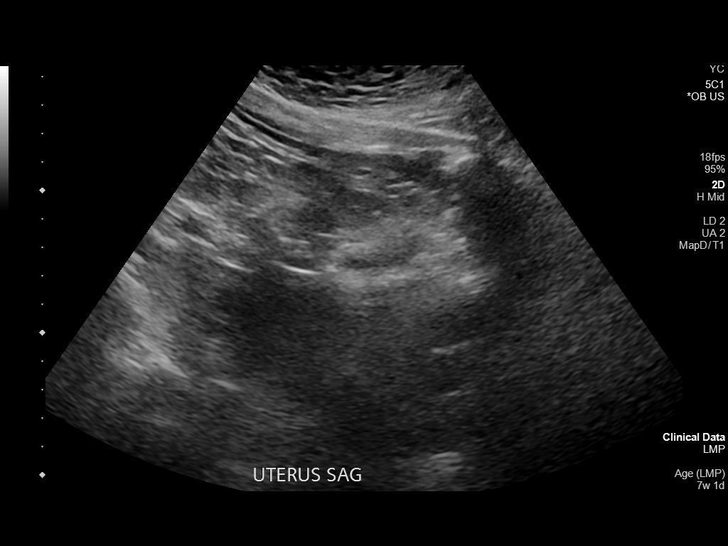
[im 12/103]
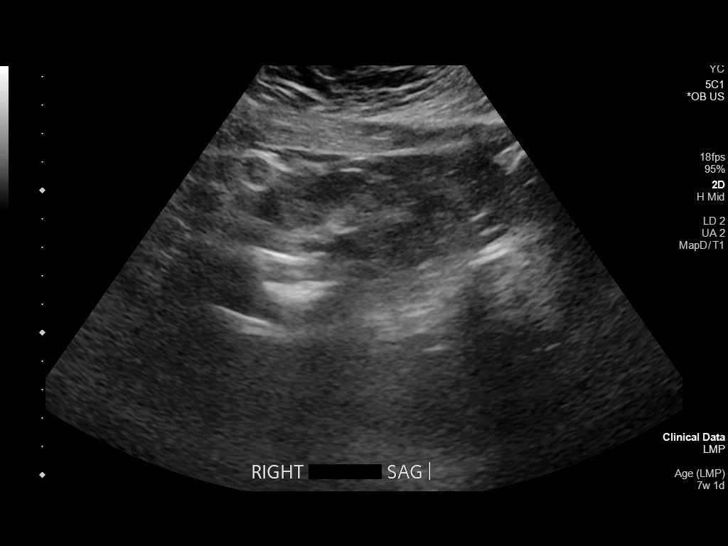
[im 19/103]
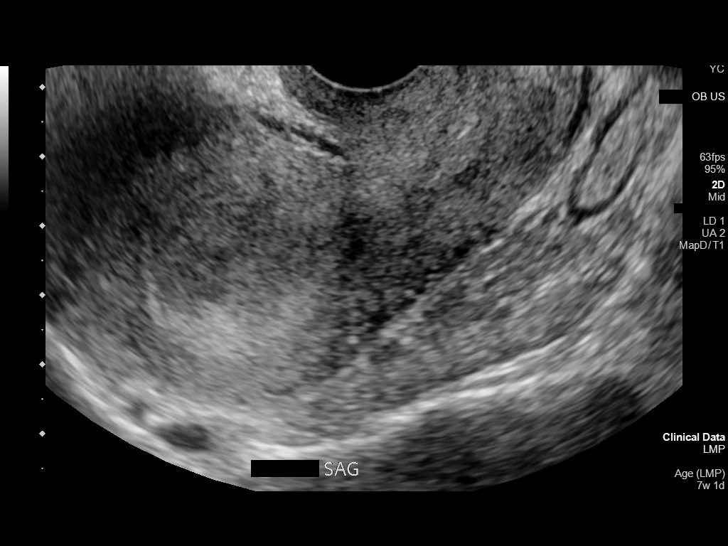
[im 27/103]
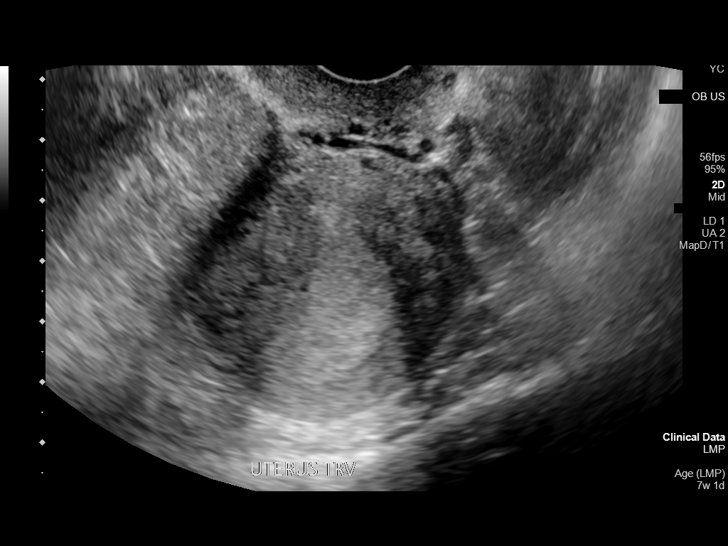
[im 35/103]
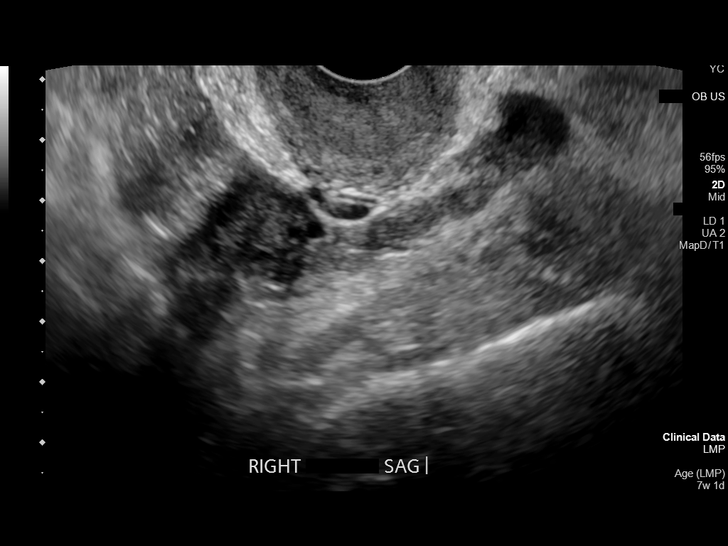
[im 42/103]
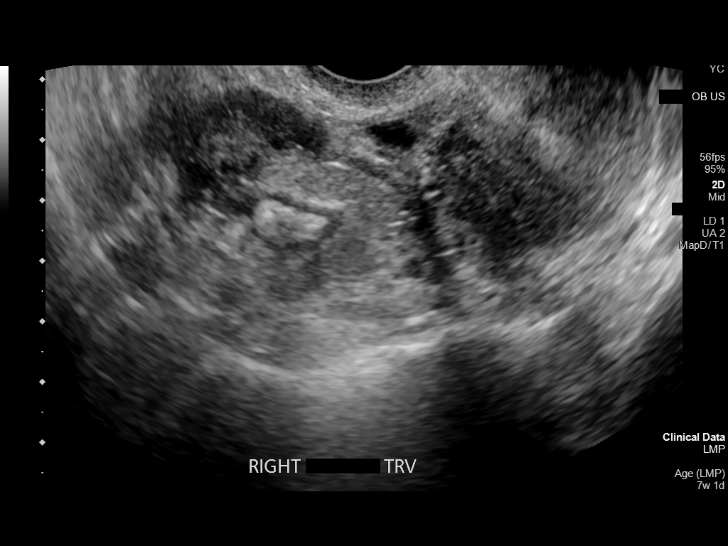
[im 53/103]
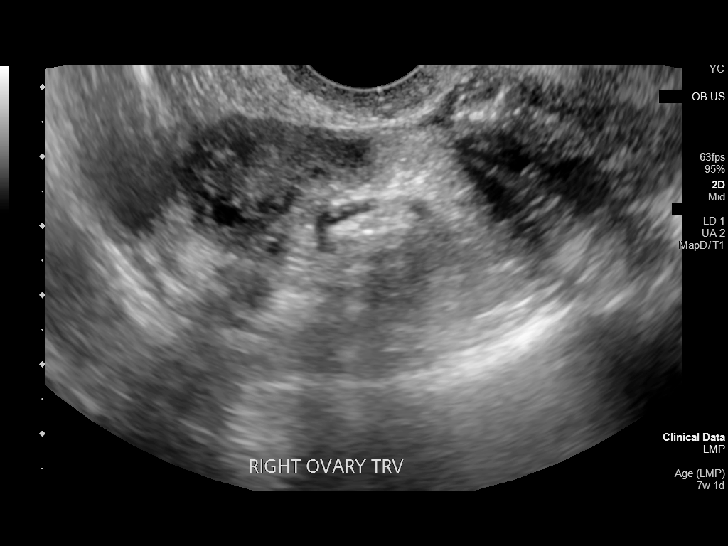
[im 61/103]
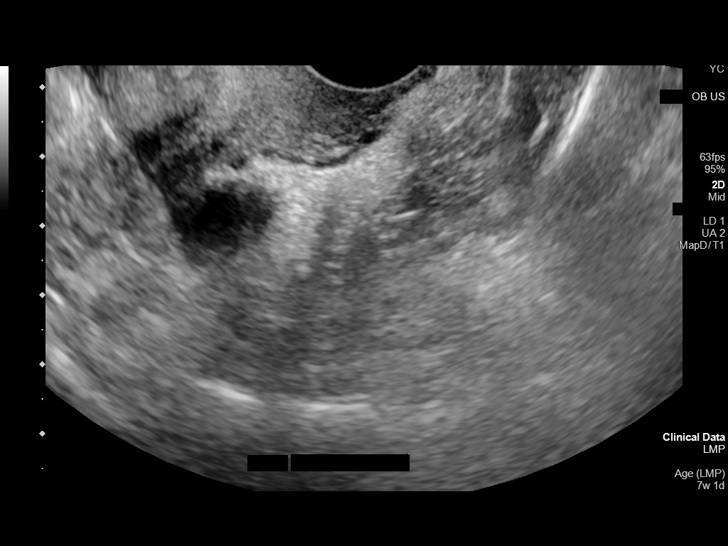
[im 69/103]
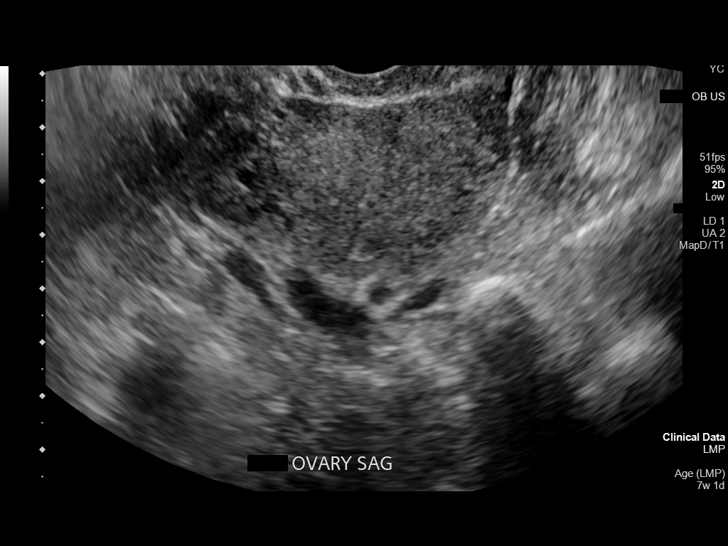
[im 76/103]
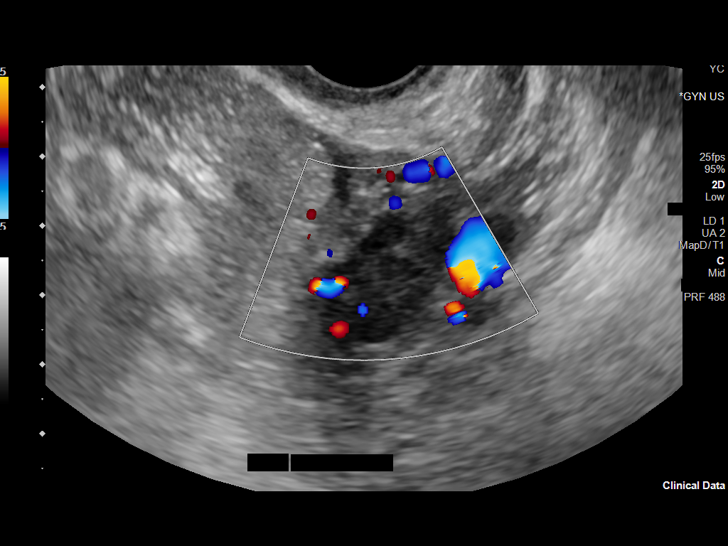
[im 84/103]
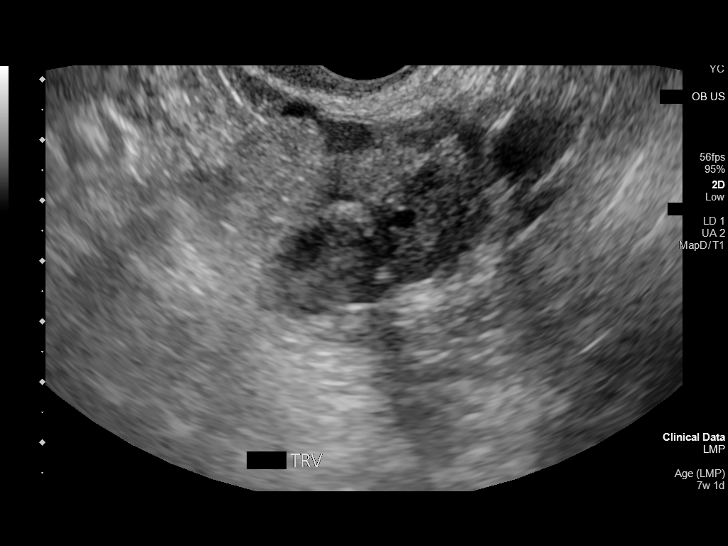
[im 91/103]
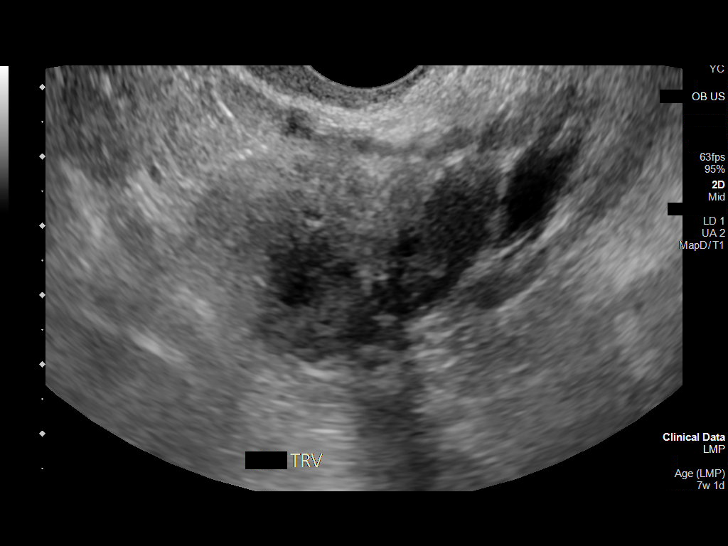
[im 99/103]
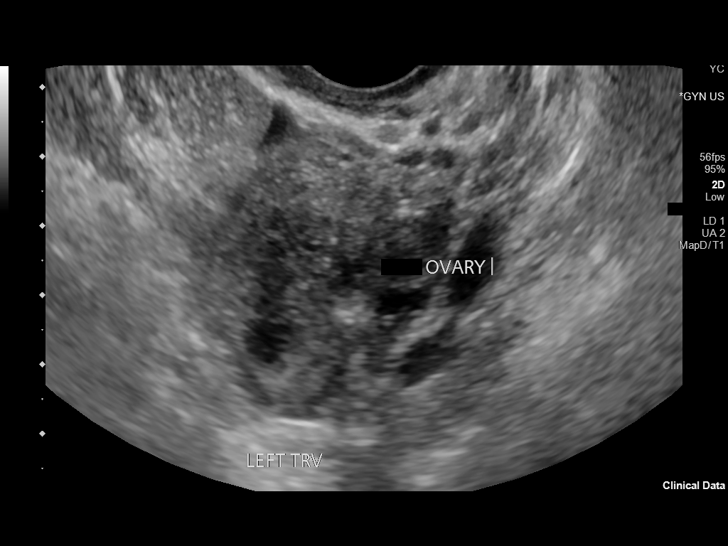

[13 of 28 positions shown; findings below may reference images not displayed]

FINDINGS: Intrauterine gestational sac: Not seen

Yolk sac:  Not seen

Embryo:  Not seen

Maternal uterus/adnexae: Right ovary is within normal limits and
measures 2.1 x 2.5 x 2 cm. The left ovary measures 2.4 x 1.6 by
cm. Within the left adnexa, separate from the left ovary is a
hypoechoic masslike region measuring 2.5 x 1.6 by 2 cm. Small free
fluid in the pelvis.
IMPRESSION: 1. No IUP identified. 2.5 cm hypoechoic masslike region in the left
adnexa is indeterminate for ectopic pregnancy though pain is
reportedly on the right side. HCG trending and repeat ultrasound
might be helpful for further clarification. Small free fluid in the
pelvis.
2. Critical Value/emergent results were called by telephone at the
time of interpretation on 02/14/2021 at [DATE] to provider DRIA
KOWAL , who verbally acknowledged these results.
